# Patient Record
Sex: Male | Born: 1950
Health system: Southern US, Community
[De-identification: ages and names within clinical notes are randomized; demographics above are authoritative.]

## PROBLEM LIST (undated history)

## (undated) DIAGNOSIS — C4490 Unspecified malignant neoplasm of skin, unspecified: Secondary | ICD-10-CM

## (undated) DIAGNOSIS — I10 Essential (primary) hypertension: Secondary | ICD-10-CM

## (undated) DIAGNOSIS — M1990 Unspecified osteoarthritis, unspecified site: Secondary | ICD-10-CM

## (undated) DIAGNOSIS — F329 Major depressive disorder, single episode, unspecified: Secondary | ICD-10-CM

## (undated) DIAGNOSIS — F32A Depression, unspecified: Secondary | ICD-10-CM

## (undated) HISTORY — PX: SKIN CANCER EXCISION: SHX779

## (undated) HISTORY — DX: Depression, unspecified: F32.A

## (undated) HISTORY — DX: Unspecified osteoarthritis, unspecified site: M19.90

## (undated) HISTORY — DX: Major depressive disorder, single episode, unspecified: F32.9

## (undated) HISTORY — PX: COLONOSCOPY: SHX174

## (undated) HISTORY — PX: INGUINAL HERNIA REPAIR: SUR1180

## (undated) HISTORY — DX: Essential (primary) hypertension: I10

---

## 2004-06-03 ENCOUNTER — Encounter: Admission: RE | Admit: 2004-06-03 | Discharge: 2004-06-03 | Payer: Self-pay | Admitting: Internal Medicine

## 2005-08-29 ENCOUNTER — Ambulatory Visit: Payer: Self-pay | Admitting: Internal Medicine

## 2005-09-13 ENCOUNTER — Ambulatory Visit: Payer: Self-pay | Admitting: Internal Medicine

## 2005-09-22 ENCOUNTER — Ambulatory Visit: Payer: Self-pay | Admitting: Internal Medicine

## 2005-10-05 ENCOUNTER — Ambulatory Visit: Payer: Self-pay | Admitting: Internal Medicine

## 2005-10-17 ENCOUNTER — Encounter: Admission: RE | Admit: 2005-10-17 | Discharge: 2005-10-17 | Payer: Self-pay | Admitting: Internal Medicine

## 2005-10-17 ENCOUNTER — Ambulatory Visit: Payer: Self-pay | Admitting: Internal Medicine

## 2005-12-27 ENCOUNTER — Ambulatory Visit (HOSPITAL_COMMUNITY): Admission: RE | Admit: 2005-12-27 | Discharge: 2005-12-27 | Payer: Self-pay | Admitting: Surgery

## 2007-05-08 ENCOUNTER — Emergency Department (HOSPITAL_COMMUNITY): Admission: EM | Admit: 2007-05-08 | Discharge: 2007-05-08 | Payer: Self-pay | Admitting: Emergency Medicine

## 2007-05-13 ENCOUNTER — Ambulatory Visit: Payer: Self-pay | Admitting: Internal Medicine

## 2007-05-13 DIAGNOSIS — F329 Major depressive disorder, single episode, unspecified: Secondary | ICD-10-CM

## 2007-05-13 DIAGNOSIS — G5600 Carpal tunnel syndrome, unspecified upper limb: Secondary | ICD-10-CM | POA: Insufficient documentation

## 2007-05-13 DIAGNOSIS — F32A Depression, unspecified: Secondary | ICD-10-CM | POA: Insufficient documentation

## 2007-05-13 DIAGNOSIS — F3289 Other specified depressive episodes: Secondary | ICD-10-CM | POA: Insufficient documentation

## 2007-05-13 DIAGNOSIS — L259 Unspecified contact dermatitis, unspecified cause: Secondary | ICD-10-CM | POA: Insufficient documentation

## 2007-05-13 DIAGNOSIS — R7989 Other specified abnormal findings of blood chemistry: Secondary | ICD-10-CM | POA: Insufficient documentation

## 2007-05-13 DIAGNOSIS — R1032 Left lower quadrant pain: Secondary | ICD-10-CM | POA: Insufficient documentation

## 2007-05-13 LAB — CONVERTED CEMR LAB
Free T4: 1.1 ng/dL (ref 0.6–1.6)
Hgb A1c MFr Bld: 5.2 % (ref 4.6–6.0)
T3, Free: 3.2 pg/mL (ref 2.3–4.2)
TSH: 0.8 microintl units/mL (ref 0.35–5.50)

## 2007-05-14 ENCOUNTER — Encounter: Admission: RE | Admit: 2007-05-14 | Discharge: 2007-05-14 | Payer: Self-pay | Admitting: Internal Medicine

## 2007-12-26 ENCOUNTER — Ambulatory Visit: Payer: Self-pay | Admitting: Internal Medicine

## 2007-12-26 DIAGNOSIS — E782 Mixed hyperlipidemia: Secondary | ICD-10-CM | POA: Insufficient documentation

## 2007-12-26 DIAGNOSIS — R079 Chest pain, unspecified: Secondary | ICD-10-CM | POA: Insufficient documentation

## 2007-12-26 LAB — CONVERTED CEMR LAB
Cholesterol, target level: 200 mg/dL
HDL goal, serum: 40 mg/dL
LDL Goal: 160 mg/dL

## 2008-11-09 DIAGNOSIS — K8020 Calculus of gallbladder without cholecystitis without obstruction: Secondary | ICD-10-CM | POA: Insufficient documentation

## 2011-04-21 NOTE — Op Note (Signed)
NAMEJANZIEL, Collin Garza                 ACCOUNT NO.:  [id number]   MEDICAL RECORD NO.:  [id number]          PATIENT TYPE:  AMB   LOCATION:  DAY                          FACILITY:  [facility]   PHYSICIAN:  Thomas A. Cornett, M.D.DATE OF BIRTH:  [birth year]   DATE OF PROCEDURE:  12/27/2005  DATE OF DISCHARGE:                                 OPERATIVE REPORT   PREOPERATIVE DIAGNOSIS:  Right inguinal hernia.   POSTOPERATIVE DIAGNOSIS:  Direct right inguinal hernia   PROCEDURE:  Repair of right inguinal hernia with PHS mesh system.   ASSISTANT SURGEON:  Maisie Fus A. Cornett, M.D.   ANESTHESIA:  LMA with 20 mL of 0.25% Sensorcaine local.   ESTIMATED BLOOD LOSS:  10 mL.   SPECIMENS:  None.   DRAINS:  None.   INDICATIONS FOR PROCEDURE:  The patient is a 60 year old male with a  progressively enlarging right inguinal hernia, which has been causing him  pain.  After discussion of this, he wished to have it repaired.  Informed  consent was obtained, and we proceeded.   DESCRIPTION OF PROCEDURE:  The patient was brought to the operating room and  placed supine.  His right inguinal region was prepped and draped in a  sterile fashion.  He received preoperative antibiotics.  An incision was  made in his right inguinal crease after infiltration of the skin with 0.25%  Sensorcaine.  Dissection was carried down through Scarpa fascia and the  superficial epigastric vessels were divided with Vicryl ties.  The  aponeurosis of the external oblique was encountered and infiltrated with  0.25% Sensorcaine.  A small incision was made in the direction of the fibers  of the external oblique using a scalpel, and Metzenbaum scissors were used  to open this.  Cord structures were identified and encircled with a one-inch  Penrose drain.  There was an indirect sac of note, and this was dissected  away from the cord structures.  There was no evidence of indirect sac.  The  ilioinguinal nerve was divided.  Once I  dissected the sac, I was able to  open the sac into the preperitoneal space.  I used a sponge to bluntly  dissect out the preperitoneal space in order to place a large Prolene hernia  system mesh in this.  We placed the preperitoneal space in the peritoneal  repaired space and then pulled it up snug with the onlay piece.  I secured  the connector piece to the transversalis muscle with a 0 Vicryl.  I then  secured the mesh down to Cooper's ligament with 0 Vicryl.  Interrupted 2-0  Prolene were used to gently secure the onlay piece of mesh to the shelving  edge of the inguinal ligament and to the conjoined tendon medially.  I slit  this cut for the cord structures, and this was secured with 2-0 Prolene.  Once the mesh was adequately secured, it laid very nicely.  Irrigation was  used and hemostasis was found to be excellent.  I closed the aponeurosis of  the external oblique with a 2-0 Vicryl.  Vicryl 3-0  was used to  close Scarpa's layer and 4-0 Monocryl was used to close the skin.  All  sponge, needle and instrument counts were found be correct at this portion  of the case.  The patient was then awoken and taken to recovery in  satisfactory condition.      Thomas A. Cornett, M.D.  Electronically Signed     TAC/MEDQ  D:  12/27/2005  T:  12/28/2005  Job:  [id number]

## 2011-09-21 LAB — URINALYSIS, ROUTINE W REFLEX MICROSCOPIC
Bilirubin Urine: NEGATIVE
Glucose, UA: NEGATIVE
Hgb urine dipstick: NEGATIVE
Nitrite: NEGATIVE
Protein, ur: NEGATIVE
Specific Gravity, Urine: 1.017
Urobilinogen, UA: 0.2
pH: 6

## 2011-09-21 LAB — COMPREHENSIVE METABOLIC PANEL
ALT: 17
AST: 20
Albumin: 4.3
Alkaline Phosphatase: 48
BUN: 11
CO2: 23
Calcium: 9.5
Chloride: 105
Creatinine, Ser: 0.87
GFR calc Af Amer: >60
GFR calc non Af Amer: >60
Glucose, Bld: 128 — ABNORMAL HIGH
Potassium: 3.7
Sodium: 139
Total Bilirubin: 1.3 — ABNORMAL HIGH
Total Protein: 6.3

## 2011-09-21 LAB — CBC
HCT: 45.5
Hemoglobin: 15.6
MCHC: 34.3
MCV: 92.4
Platelets: 220
RBC: 4.93
RDW: 13.4
WBC: 12 — ABNORMAL HIGH

## 2011-09-21 LAB — DIFFERENTIAL
Basophils Absolute: 0
Basophils Relative: 0
Eosinophils Absolute: 0
Eosinophils Relative: 0
Lymphocytes Relative: 15
Lymphs Abs: 1.8
Monocytes Absolute: 0.8 — ABNORMAL HIGH
Monocytes Relative: 7
Neutro Abs: 9.3 — ABNORMAL HIGH
Neutrophils Relative %: 78 — ABNORMAL HIGH

## 2013-07-29 DIAGNOSIS — R209 Unspecified disturbances of skin sensation: Secondary | ICD-10-CM | POA: Insufficient documentation

## 2015-04-23 ENCOUNTER — Encounter: Payer: Self-pay | Admitting: Internal Medicine

## 2015-07-09 LAB — CBC AND DIFFERENTIAL
HCT: 48 (ref 41–53)
HEMOGLOBIN: 16.2 (ref 13.5–17.5)
WBC: 8.6

## 2015-08-17 ENCOUNTER — Encounter: Payer: Self-pay | Admitting: Internal Medicine

## 2015-09-22 ENCOUNTER — Ambulatory Visit (AMBULATORY_SURGERY_CENTER): Payer: Self-pay

## 2015-09-22 VITALS — Ht 71.0 in | Wt 187.6 lb

## 2015-09-22 DIAGNOSIS — Z1211 Encounter for screening for malignant neoplasm of colon: Secondary | ICD-10-CM

## 2015-09-22 NOTE — Progress Notes (Signed)
No allergies to eggs or soy No diet/weight loss meds No home oxygen No past problems with anesthesia  Refused emmi 

## 2015-10-06 ENCOUNTER — Encounter: Payer: Self-pay | Admitting: Internal Medicine

## 2015-10-14 ENCOUNTER — Encounter: Payer: Self-pay | Admitting: Internal Medicine

## 2015-12-15 ENCOUNTER — Encounter: Payer: Self-pay | Admitting: Internal Medicine

## 2015-12-15 ENCOUNTER — Ambulatory Visit (AMBULATORY_SURGERY_CENTER): Payer: BLUE CROSS/BLUE SHIELD | Admitting: Internal Medicine

## 2015-12-15 VITALS — BP 113/65 | HR 62 | Temp 97.1°F | Resp 24 | Ht 71.0 in | Wt 187.0 lb

## 2015-12-15 DIAGNOSIS — Z1211 Encounter for screening for malignant neoplasm of colon: Secondary | ICD-10-CM

## 2015-12-15 MED ORDER — SODIUM CHLORIDE 0.9 % IV SOLN: 500.0000 mL | INTRAVENOUS | Status: DC

## 2015-12-15 NOTE — Progress Notes (Signed)
Report to PACU, RN, vss, BBS= Clear.  

## 2015-12-15 NOTE — Op Note (Signed)
[name] [place], [id number]   COLONOSCOPY PROCEDURE REPORT  PATIENT: Collin Garza, Collin Garza  MR#: [id number] BIRTHDATE: Nov 13, 1951 , 64  yrs. old GENDER: male ENDOSCOPIST: Gatha Mayer, MD, [facility] PROCEDURE DATE:  12/15/2015 PROCEDURE:   Colonoscopy, screening First Screening Colonoscopy - Avg.  risk and is 50 yrs.  old or older - No.  Prior Negative Screening - Now for repeat screening. 10 or more years since last screening  History of Adenoma - Now for follow-up colonoscopy & has been > or = to 3 yrs.  N/A  Polyps removed today? No Recommend repeat exam, <10 yrs? No ASA CLASS:   Class II INDICATIONS:Screening for colonic neoplasia and Colorectal Neoplasm Risk Assessment for this procedure is average risk. MEDICATIONS: Propofol 250 mg IV and Monitored anesthesia care  DESCRIPTION OF PROCEDURE:   After the risks benefits and alternatives of the procedure were thoroughly explained, informed consent was obtained.  The digital rectal exam revealed no abnormalities of the rectum, revealed the prostate was not enlarged, and revealed no prostatic nodules.   The LB [id number]  endoscope was introduced through the anus and advanced to the cecum, which was identified by both the appendix and ileocecal valve. No adverse events experienced.   The quality of the prep was excellent.  (MiraLax was used)  The instrument was then slowly withdrawn as the colon was fully examined. Estimated blood loss is zero unless otherwise noted in this procedure report.      COLON FINDINGS: There was diverticulosis noted in the sigmoid colon. The examination was otherwise normal.  Retroflexed views revealed no abnormalities. The time to cecum = 3.7 Withdrawal time = 7.6 The scope was withdrawn and the procedure completed. COMPLICATIONS: There were no immediate complications.  ENDOSCOPIC IMPRESSION: 1.   Diverticulosis was noted in the sigmoid colon 2.   The examination  was otherwise normal - excellent prep - second screening  RECOMMENDATIONS: Repeat colonoscopy 10 years. 2027  eSigned:  Gatha Mayer, MD, [facility] 12/15/2015 12:05 PM   cc: The Patient and Dr. Bernerd Limbo

## 2015-12-15 NOTE — Patient Instructions (Addendum)
No polyps or cancer seen.  You do have diverticulosis - thickened muscle rings and pouches in the colon wall. Please read the handout about this condition.  Next routine colonoscopy in 10 years - 2027  I appreciate the opportunity to care for you. Gatha Mayer, MD, FACG  YOU HAD AN ENDOSCOPIC PROCEDURE TODAY AT [facility]:   Refer to the procedure report that was given to you for any specific questions about what was found during the examination.  If the procedure report does not answer your questions, please call your gastroenterologist to clarify.  If you requested that your care partner not be given the details of your procedure findings, then the procedure report has been included in a sealed envelope for you to review at your convenience later.  YOU SHOULD EXPECT: Some feelings of bloating in the abdomen. Passage of more gas than usual.  Walking can help get rid of the air that was put into your GI tract during the procedure and reduce the bloating. If you had a lower endoscopy (such as a colonoscopy or flexible sigmoidoscopy) you may notice spotting of blood in your stool or on the toilet paper. If you underwent a bowel prep for your procedure, you may not have a normal bowel movement for a few days.  Please Note:  You might notice some irritation and congestion in your nose or some drainage.  This is from the oxygen used during your procedure.  There is no need for concern and it should clear up in a day or so.  SYMPTOMS TO REPORT IMMEDIATELY:   Following lower endoscopy (colonoscopy or flexible sigmoidoscopy):  Excessive amounts of blood in the stool  Significant tenderness or worsening of abdominal pains  Swelling of the abdomen that is new, acute  Fever of 100F or higher   For urgent or emergent issues, a gastroenterologist can be reached at any hour by calling [phone].   DIET: Your first meal following the procedure should be a small meal  and then it is ok to progress to your normal diet. Heavy or fried foods are harder to digest and may make you feel nauseous or bloated.  Likewise, meals heavy in dairy and vegetables can increase bloating.  Drink plenty of fluids but you should avoid alcoholic beverages for 24 hours.  ACTIVITY:  You should plan to take it easy for the rest of today and you should NOT DRIVE or use heavy machinery until tomorrow (because of the sedation medicines used during the test).    FOLLOW UP: Our staff will call the number listed on your records the next business day following your procedure to check on you and address any questions or concerns that you may have regarding the information given to you following your procedure. If we do not reach you, we will leave a message.  However, if you are feeling well and you are not experiencing any problems, there is no need to return our call.  We will assume that you have returned to your regular daily activities without incident.  If any biopsies were taken you will be contacted by phone or by letter within the next 1-3 weeks.  Please call us at [phone] if you have not heard about the biopsies in 3 weeks.    SIGNATURES/CONFIDENTIALITY: You and/or your care partner have signed paperwork which will be entered into your electronic medical record.  These signatures attest to the fact that that the information above  on your After Visit Summary has been reviewed and is understood.  Full responsibility of the confidentiality of this discharge information lies with you and/or your care-partner.

## 2015-12-16 ENCOUNTER — Telehealth: Payer: Self-pay

## 2015-12-16 NOTE — Telephone Encounter (Signed)
  Follow up Call-  Call back number 12/15/2015  Post procedure Call Back phone  # [phone]  Permission to leave phone message Yes     Patient was called for follow up after procedure on 12/15/2015. No answer at the number given for follow up. A message was left on the answering machine.

## 2016-03-30 DIAGNOSIS — L408 Other psoriasis: Secondary | ICD-10-CM | POA: Diagnosis not present

## 2016-03-30 DIAGNOSIS — L821 Other seborrheic keratosis: Secondary | ICD-10-CM | POA: Diagnosis not present

## 2016-03-30 DIAGNOSIS — Z86018 Personal history of other benign neoplasm: Secondary | ICD-10-CM | POA: Diagnosis not present

## 2016-03-30 DIAGNOSIS — D225 Melanocytic nevi of trunk: Secondary | ICD-10-CM | POA: Diagnosis not present

## 2016-03-30 DIAGNOSIS — D485 Neoplasm of uncertain behavior of skin: Secondary | ICD-10-CM | POA: Diagnosis not present

## 2016-03-30 DIAGNOSIS — D2239 Melanocytic nevi of other parts of face: Secondary | ICD-10-CM | POA: Diagnosis not present

## 2016-04-25 DIAGNOSIS — I10 Essential (primary) hypertension: Secondary | ICD-10-CM | POA: Diagnosis not present

## 2016-04-25 DIAGNOSIS — E784 Other hyperlipidemia: Secondary | ICD-10-CM | POA: Diagnosis not present

## 2016-04-25 DIAGNOSIS — Z1159 Encounter for screening for other viral diseases: Secondary | ICD-10-CM | POA: Diagnosis not present

## 2016-04-25 DIAGNOSIS — Z125 Encounter for screening for malignant neoplasm of prostate: Secondary | ICD-10-CM | POA: Diagnosis not present

## 2016-04-25 LAB — HEPATIC FUNCTION PANEL
ALT: 12 (ref 10–40)
AST: 21 (ref 14–40)
Alkaline Phosphatase: 69 (ref 25–125)
BILIRUBIN, TOTAL: 0.7

## 2016-04-25 LAB — BASIC METABOLIC PANEL
BUN: 16 (ref 4–21)
Creatinine: 0.9 (ref <=1.3)
Glucose: 116
Potassium: 5.2 (ref 3.4–5.3)
SODIUM: 136 — AB (ref 137–147)

## 2016-04-25 LAB — PSA: PSA: 3.7

## 2016-04-25 LAB — LIPID PANEL
Cholesterol: 228 — AB (ref 0–200)
HDL: 79 — AB (ref 35–70)
LDL CALC: 122
TRIGLYCERIDES: 135 (ref 40–160)

## 2016-09-07 DIAGNOSIS — M79605 Pain in left leg: Secondary | ICD-10-CM | POA: Diagnosis not present

## 2016-09-07 DIAGNOSIS — R634 Abnormal weight loss: Secondary | ICD-10-CM | POA: Diagnosis not present

## 2016-09-07 DIAGNOSIS — R61 Generalized hyperhidrosis: Secondary | ICD-10-CM | POA: Diagnosis not present

## 2016-09-07 DIAGNOSIS — Z23 Encounter for immunization: Secondary | ICD-10-CM | POA: Diagnosis not present

## 2016-09-07 DIAGNOSIS — M79604 Pain in right leg: Secondary | ICD-10-CM | POA: Diagnosis not present

## 2016-09-07 DIAGNOSIS — Z0000 Encounter for general adult medical examination without abnormal findings: Secondary | ICD-10-CM | POA: Diagnosis not present

## 2016-09-07 DIAGNOSIS — R0782 Intercostal pain: Secondary | ICD-10-CM | POA: Diagnosis not present

## 2016-09-08 DIAGNOSIS — K8020 Calculus of gallbladder without cholecystitis without obstruction: Secondary | ICD-10-CM | POA: Diagnosis not present

## 2016-09-08 DIAGNOSIS — I708 Atherosclerosis of other arteries: Secondary | ICD-10-CM | POA: Diagnosis not present

## 2016-09-08 DIAGNOSIS — M5136 Other intervertebral disc degeneration, lumbar region: Secondary | ICD-10-CM | POA: Diagnosis not present

## 2016-09-08 DIAGNOSIS — R079 Chest pain, unspecified: Secondary | ICD-10-CM | POA: Diagnosis not present

## 2016-09-08 DIAGNOSIS — J449 Chronic obstructive pulmonary disease, unspecified: Secondary | ICD-10-CM | POA: Diagnosis not present

## 2017-01-02 DIAGNOSIS — Z6824 Body mass index (BMI) 24.0-24.9, adult: Secondary | ICD-10-CM | POA: Diagnosis not present

## 2017-01-02 DIAGNOSIS — W19XXXA Unspecified fall, initial encounter: Secondary | ICD-10-CM | POA: Diagnosis not present

## 2017-01-02 DIAGNOSIS — R2689 Other abnormalities of gait and mobility: Secondary | ICD-10-CM | POA: Diagnosis not present

## 2017-01-02 DIAGNOSIS — H6122 Impacted cerumen, left ear: Secondary | ICD-10-CM | POA: Diagnosis not present

## 2017-01-03 DIAGNOSIS — R42 Dizziness and giddiness: Secondary | ICD-10-CM | POA: Diagnosis not present

## 2017-01-03 DIAGNOSIS — W19XXXA Unspecified fall, initial encounter: Secondary | ICD-10-CM | POA: Diagnosis not present

## 2017-01-03 DIAGNOSIS — R2689 Other abnormalities of gait and mobility: Secondary | ICD-10-CM | POA: Diagnosis not present

## 2017-04-04 DIAGNOSIS — L723 Sebaceous cyst: Secondary | ICD-10-CM | POA: Diagnosis not present

## 2017-04-04 DIAGNOSIS — D2261 Melanocytic nevi of right upper limb, including shoulder: Secondary | ICD-10-CM | POA: Diagnosis not present

## 2017-04-04 DIAGNOSIS — D485 Neoplasm of uncertain behavior of skin: Secondary | ICD-10-CM | POA: Diagnosis not present

## 2017-04-04 DIAGNOSIS — L821 Other seborrheic keratosis: Secondary | ICD-10-CM | POA: Diagnosis not present

## 2017-04-04 DIAGNOSIS — Z86018 Personal history of other benign neoplasm: Secondary | ICD-10-CM | POA: Diagnosis not present

## 2017-04-04 DIAGNOSIS — L570 Actinic keratosis: Secondary | ICD-10-CM | POA: Diagnosis not present

## 2017-11-20 DIAGNOSIS — Z23 Encounter for immunization: Secondary | ICD-10-CM | POA: Diagnosis not present

## 2017-12-06 ENCOUNTER — Encounter: Payer: Self-pay | Admitting: Family Medicine

## 2017-12-06 ENCOUNTER — Ambulatory Visit (INDEPENDENT_AMBULATORY_CARE_PROVIDER_SITE_OTHER): Payer: BLUE CROSS/BLUE SHIELD

## 2017-12-06 ENCOUNTER — Ambulatory Visit: Payer: BLUE CROSS/BLUE SHIELD | Admitting: Family Medicine

## 2017-12-06 VITALS — BP 142/98 | HR 70 | Ht 71.0 in | Wt 177.1 lb

## 2017-12-06 DIAGNOSIS — I10 Essential (primary) hypertension: Secondary | ICD-10-CM | POA: Diagnosis not present

## 2017-12-06 DIAGNOSIS — M8588 Other specified disorders of bone density and structure, other site: Secondary | ICD-10-CM

## 2017-12-06 DIAGNOSIS — S20211A Contusion of right front wall of thorax, initial encounter: Secondary | ICD-10-CM | POA: Diagnosis not present

## 2017-12-06 DIAGNOSIS — I2510 Atherosclerotic heart disease of native coronary artery without angina pectoris: Secondary | ICD-10-CM | POA: Diagnosis not present

## 2017-12-06 DIAGNOSIS — E559 Vitamin D deficiency, unspecified: Secondary | ICD-10-CM | POA: Diagnosis not present

## 2017-12-06 DIAGNOSIS — E782 Mixed hyperlipidemia: Secondary | ICD-10-CM

## 2017-12-06 DIAGNOSIS — R972 Elevated prostate specific antigen [PSA]: Secondary | ICD-10-CM | POA: Diagnosis not present

## 2017-12-06 DIAGNOSIS — E875 Hyperkalemia: Secondary | ICD-10-CM | POA: Diagnosis not present

## 2017-12-06 DIAGNOSIS — Z0000 Encounter for general adult medical examination without abnormal findings: Secondary | ICD-10-CM | POA: Insufficient documentation

## 2017-12-06 DIAGNOSIS — S2241XA Multiple fractures of ribs, right side, initial encounter for closed fracture: Secondary | ICD-10-CM | POA: Diagnosis not present

## 2017-12-06 MED ORDER — TRAMADOL HCL 50 MG PO TABS
50.0000 mg | ORAL_TABLET | Freq: Every day | ORAL | 0 refills | Status: DC
Start: 2017-12-06 — End: 2018-06-17

## 2017-12-06 MED ORDER — AMLODIPINE BESYLATE 5 MG PO TABS: 5.0000 mg | ORAL_TABLET | Freq: Every day | ORAL | 0 refills | Status: DC

## 2017-12-06 NOTE — Progress Notes (Addendum)
Subjective:  Patient ID: Collin Garza, male    DOB: May 27, 1951  Age: 67 y.o. MRN: [id number]  CC: Establish Care   HPI Collin Garza presents for establishment of care and to be evaluated for a chest wall contusion he sustained after slipping on the ice and snow a few weeks.  He tells that he slipped and fell backwards with the front of his weight falling onto the right posterior chest wall.  He has a history of fractured ribs during [place] 45 years ago.  He is treated these with a rib binder and an occasional Aleve.  They seem to be doing better.  He has a history of high blood pressure but is not currently taking medicine.  He lives with his wife.  He and his wife have been raising their 24 year old grandson since he was a small child.  Another granddaughter recently moved back in with him.  The 65 year old is a special needs child.  He is nonfasting today and will return fasting.  He quit smoking 45 years ago.  He drinks 6 ounces of alcohol weekly.  He is retired from [employer].  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  History Collin Garza has a past medical history of Arthritis, Depression, and Hypertension.   He has a past surgical history that includes Inguinal hernia repair and Colonoscopy.   His family history includes Alcohol abuse in his brother and daughter; Arthritis in his brother; Asthma in his brother; Breast cancer in his maternal aunt and maternal grandmother; COPD in his brother, father, and paternal grandmother; Cancer in his father and maternal grandmother; Depression in his brother, daughter, and mother; Diabetes in his daughter and maternal uncle; Early death in his mother; Hearing loss in his brother and father; Heart attack in his brother and mother; Heart disease in his maternal uncle; Pancreatic cancer in his maternal grandmother; Prostate cancer in his cousin; Stomach cancer in his paternal aunt.He reports that he has quit smoking. he has never used smokeless tobacco.  He reports that he drinks about 4.2 - 5.4 oz of alcohol per week. He reports that he does not use drugs.  Outpatient Medications Prior to Visit  Medication Sig Dispense Refill  . co-enzyme Q-10 30 MG capsule Take 300 mg by mouth every other day.     [name] amLODipine (NORVASC) 10 MG tablet Take 10 mg by mouth.    [name] aspirin 81 MG tablet Take 81 mg by mouth daily.    . Multiple Vitamin (MULTIVITAMIN) tablet Take 1 tablet by mouth daily.     No facility-administered medications prior to visit.     ROS Review of Systems  Constitutional: Negative.   HENT: Negative.   Eyes: Negative.   Respiratory: Negative for chest tightness and shortness of breath.   Cardiovascular: Negative.   Gastrointestinal: Negative.   Endocrine: Negative for polyphagia and polyuria.  Skin: Negative.     Objective:  BP (!) 142/98 (BP Location: Left Arm, Patient Position: Sitting, Cuff Size: Normal)   Pulse 70   Ht 5\' 11"  (1.803 m)   Wt 177 lb 2 oz (80.3 kg)   SpO2 99%   BMI 24.70 kg/m   Physical Exam  Constitutional: He is oriented to person, place, and time. He appears well-developed and well-nourished. No distress.  HENT:  Head: Normocephalic and atraumatic.  Right Ear: External ear normal.  Left Ear: External ear normal.  Mouth/Throat: Oropharynx is clear and moist. No oropharyngeal exudate.  Eyes: Conjunctivae are normal.  Pupils are equal, round, and reactive to light. Right eye exhibits no discharge. Left eye exhibits no discharge. No scleral icterus.  Neck: Neck supple. No JVD present. No tracheal deviation present. No thyromegaly present.  Cardiovascular: Normal rate, regular rhythm and normal heart sounds.  Pulmonary/Chest: Effort normal and breath sounds normal. No stridor.      Abdominal: Bowel sounds are normal.  Lymphadenopathy:    He has no cervical adenopathy.  Neurological: He is alert and oriented to person, place, and time.  Skin: Skin is warm and dry. He is not diaphoretic. No  pallor.  Psychiatric: He has a normal mood and affect. His behavior is normal.      Assessment & Plan:   Collin Garza was seen today for establish care.  Diagnoses and all orders for this visit:  Chest wall contusion, right, initial encounter -     Cancel: DG Ribs Unilateral Right; Future -     Cancel: DG Ribs Unilateral Right -     traMADol (ULTRAM) 50 MG tablet; Take 1 tablet (50 mg total) by mouth at bedtime. As needed for pain  HYPERLIPIDEMIA -     Cancel: Comprehensive metabolic panel -     Cancel: Lipid panel -     Comprehensive metabolic panel; Future -     Lipid panel; Future  Healthcare maintenance -     Cancel: CBC -     Cancel: Comprehensive metabolic panel -     Cancel: HIV antibody -     Cancel: PSA -     Cancel: TSH -     Cancel: Urinalysis, Routine w reflex microscopic -     Cancel: VITAMIN D 25 Hydroxy (Vit-D Deficiency, Fractures); Future -     CBC; Future -     Comprehensive metabolic panel; Future -     HIV antibody; Future -     Lipid panel; Future -     PSA; Future -     TSH; Future -     Urinalysis, Routine w reflex microscopic; Future -     VITAMIN D 25 Hydroxy (Vit-D Deficiency, Fractures); Future  Essential hypertension -     CBC; Future -     Comprehensive metabolic panel; Future -     TSH; Future -     Urinalysis, Routine w reflex microscopic; Future -     amLODipine (NORVASC) 5 MG tablet; Take 1 tablet (5 mg total) by mouth daily.  ASCVD (arteriosclerotic cardiovascular disease)  Osteopenia of other site -     DG Bone Density; Future  Vitamin D deficiency -     Vitamin D (Ergocalciferol) (DRISDOL) capsule 50,000 Units  Elevated PSA  Serum potassium elevated   I have discontinued Collin Garza's multivitamin, aspirin, and amLODipine. I am also having him start on amLODipine and traMADol. Additionally, I am having him maintain his co-enzyme Q-10. We will continue to administer Vitamin D (Ergocalciferol).  Meds ordered this encounter    Medications  . amLODipine (NORVASC) 5 MG tablet    Sig: Take 1 tablet (5 mg total) by mouth daily.    Dispense:  90 tablet    Refill:  0  . traMADol (ULTRAM) 50 MG tablet    Sig: Take 1 tablet (50 mg total) by mouth at bedtime. As needed for pain    Dispense:  30 tablet    Refill:  0  . Vitamin D (Ergocalciferol) (DRISDOL) capsule 50,000 Units     Follow-up: No Follow-up on file.  Gwyndolyn Saxon  Krystal Clark, MD

## 2017-12-07 ENCOUNTER — Other Ambulatory Visit: Payer: Self-pay | Admitting: Family Medicine

## 2017-12-07 DIAGNOSIS — M85859 Other specified disorders of bone density and structure, unspecified thigh: Secondary | ICD-10-CM | POA: Insufficient documentation

## 2017-12-07 DIAGNOSIS — I2510 Atherosclerotic heart disease of native coronary artery without angina pectoris: Secondary | ICD-10-CM | POA: Insufficient documentation

## 2017-12-07 DIAGNOSIS — S20211A Contusion of right front wall of thorax, initial encounter: Secondary | ICD-10-CM

## 2017-12-07 NOTE — Addendum Note (Signed)
Addended by: Jon Billings on: 12/07/2017 09:42 AM   Modules accepted: Orders

## 2017-12-12 ENCOUNTER — Other Ambulatory Visit (INDEPENDENT_AMBULATORY_CARE_PROVIDER_SITE_OTHER): Payer: BLUE CROSS/BLUE SHIELD

## 2017-12-12 DIAGNOSIS — Z0000 Encounter for general adult medical examination without abnormal findings: Secondary | ICD-10-CM

## 2017-12-12 DIAGNOSIS — I10 Essential (primary) hypertension: Secondary | ICD-10-CM

## 2017-12-12 DIAGNOSIS — E782 Mixed hyperlipidemia: Secondary | ICD-10-CM | POA: Diagnosis not present

## 2017-12-12 LAB — URINALYSIS, ROUTINE W REFLEX MICROSCOPIC
Bilirubin Urine: NEGATIVE
HGB URINE DIPSTICK: NEGATIVE
Ketones, ur: NEGATIVE
Leukocytes, UA: NEGATIVE
Nitrite: NEGATIVE
PH: 7.5 (ref 5.0–8.0)
RBC / HPF: NONE SEEN (ref 0–?)
SPECIFIC GRAVITY, URINE: 1.015 (ref 1.000–1.030)
TOTAL PROTEIN, URINE-UPE24: NEGATIVE
Urine Glucose: NEGATIVE
Urobilinogen, UA: 0.2 (ref 0.0–1.0)

## 2017-12-12 LAB — CBC
HEMATOCRIT: 45.9 % (ref 39.0–52.0)
Hemoglobin: 15.3 g/dL (ref 13.0–17.0)
MCHC: 33.3 g/dL (ref 30.0–36.0)
MCV: 96.3 fl (ref 78.0–100.0)
Platelets: 228 10*3/uL (ref 150.0–400.0)
RBC: 4.77 Mil/uL (ref 4.22–5.81)
RDW: 14 % (ref 11.5–15.5)
WBC: 7.2 10*3/uL (ref 4.0–10.5)

## 2017-12-12 LAB — LIPID PANEL
CHOLESTEROL: 199 mg/dL (ref 0–200)
HDL: 67.1 mg/dL (ref >39.00)
LDL CALC: 110 mg/dL — AB (ref 0–99)
NonHDL: 131.42
TRIGLYCERIDES: 105 mg/dL (ref 0.0–149.0)
Total CHOL/HDL Ratio: 3
VLDL: 21 mg/dL (ref 0.0–40.0)

## 2017-12-12 LAB — COMPREHENSIVE METABOLIC PANEL
ALBUMIN: 4.4 g/dL (ref 3.5–5.2)
ALT: 11 U/L (ref 0–53)
AST: 14 U/L (ref 0–37)
Alkaline Phosphatase: 63 U/L (ref 39–117)
BUN: 13 mg/dL (ref 6–23)
CALCIUM: 9.7 mg/dL (ref 8.4–10.5)
CHLORIDE: 104 meq/L (ref 96–112)
CO2: 33 mEq/L — ABNORMAL HIGH (ref 19–32)
CREATININE: 0.77 mg/dL (ref 0.40–1.50)
GFR: 108.01 mL/min (ref >60.00)
Glucose, Bld: 106 mg/dL — ABNORMAL HIGH (ref 70–99)
Potassium: 5.5 mEq/L — ABNORMAL HIGH (ref 3.5–5.1)
Sodium: 144 mEq/L (ref 135–145)
Total Bilirubin: 0.6 mg/dL (ref 0.2–1.2)
Total Protein: 6.4 g/dL (ref 6.0–8.3)

## 2017-12-12 LAB — VITAMIN D 25 HYDROXY (VIT D DEFICIENCY, FRACTURES): VITD: 18.82 ng/mL — AB (ref 30.00–100.00)

## 2017-12-12 LAB — PSA: PSA: 4.03 ng/mL — ABNORMAL HIGH (ref 0.10–4.00)

## 2017-12-12 LAB — TSH: TSH: 0.81 u[IU]/mL (ref 0.35–4.50)

## 2017-12-13 DIAGNOSIS — E875 Hyperkalemia: Secondary | ICD-10-CM | POA: Insufficient documentation

## 2017-12-13 DIAGNOSIS — E559 Vitamin D deficiency, unspecified: Secondary | ICD-10-CM | POA: Insufficient documentation

## 2017-12-13 DIAGNOSIS — R972 Elevated prostate specific antigen [PSA]: Secondary | ICD-10-CM | POA: Insufficient documentation

## 2017-12-13 LAB — HIV ANTIBODY (ROUTINE TESTING W REFLEX): HIV: NONREACTIVE

## 2017-12-13 MED ORDER — VITAMIN D (ERGOCALCIFEROL) 1.25 MG (50000 UNIT) PO CAPS: 50000.0000 [IU] | ORAL_CAPSULE | ORAL | Status: DC

## 2017-12-13 NOTE — Addendum Note (Signed)
Addended by: Jon Billings on: 12/13/2017 09:45 AM   Modules accepted: Orders

## 2017-12-18 ENCOUNTER — Encounter: Payer: Self-pay | Admitting: Family Medicine

## 2017-12-18 ENCOUNTER — Ambulatory Visit (INDEPENDENT_AMBULATORY_CARE_PROVIDER_SITE_OTHER): Payer: BLUE CROSS/BLUE SHIELD | Admitting: Family Medicine

## 2017-12-18 VITALS — BP 128/80 | HR 75 | Ht 71.0 in | Wt 176.2 lb

## 2017-12-18 DIAGNOSIS — R7989 Other specified abnormal findings of blood chemistry: Secondary | ICD-10-CM | POA: Diagnosis not present

## 2017-12-18 DIAGNOSIS — E559 Vitamin D deficiency, unspecified: Secondary | ICD-10-CM | POA: Diagnosis not present

## 2017-12-18 DIAGNOSIS — M8588 Other specified disorders of bone density and structure, other site: Secondary | ICD-10-CM | POA: Diagnosis not present

## 2017-12-18 DIAGNOSIS — I2510 Atherosclerotic heart disease of native coronary artery without angina pectoris: Secondary | ICD-10-CM

## 2017-12-18 DIAGNOSIS — E875 Hyperkalemia: Secondary | ICD-10-CM | POA: Diagnosis not present

## 2017-12-18 DIAGNOSIS — R972 Elevated prostate specific antigen [PSA]: Secondary | ICD-10-CM

## 2017-12-18 DIAGNOSIS — I10 Essential (primary) hypertension: Secondary | ICD-10-CM | POA: Diagnosis not present

## 2017-12-18 DIAGNOSIS — E782 Mixed hyperlipidemia: Secondary | ICD-10-CM

## 2017-12-18 LAB — BASIC METABOLIC PANEL
BUN: 15 mg/dL (ref 6–23)
CO2: 30 mEq/L (ref 19–32)
CREATININE: 0.76 mg/dL (ref 0.40–1.50)
Calcium: 10.1 mg/dL (ref 8.4–10.5)
Chloride: 98 mEq/L (ref 96–112)
GFR: 109.64 mL/min (ref >60.00)
GLUCOSE: 121 mg/dL — AB (ref 70–99)
POTASSIUM: 5 meq/L (ref 3.5–5.1)
Sodium: 139 mEq/L (ref 135–145)

## 2017-12-18 NOTE — Progress Notes (Signed)
Subjective:  Patient ID: Collin Garza, male    DOB: [birth year]  Age: 67 y.o. MRN: [id number]  CC: Annual Exam   HPI CRAY MONNIN presents for a CPE and follow-up of his fractured ribs and lab abnormalities.  With his elevated PSA has a long-standing history of enlarged prostate he does experience nocturia x1 with some dribbling at that time.  He tells about excellent urine flow during the daytime when he has been up and about.  He is somewhat wary about prescription medicines because his mother had lupus and had problems with drug interactions.  He is not a milk drinker but does enjoy cheese.  I urged him to fill the vitamin D and start taking it.  I also urged him to go for the DEXA scan.  His rib fracture are healing he says that he only had to take 1 of the Ultram.  Outpatient Medications Prior to Visit  Medication Sig Dispense Refill  . amLODipine (NORVASC) 5 MG tablet Take 1 tablet (5 mg total) by mouth daily. 90 tablet 0  . co-enzyme Q-10 30 MG capsule Take 300 mg by mouth every other day.     . traMADol (ULTRAM) 50 MG tablet Take 1 tablet (50 mg total) by mouth at bedtime. As needed for pain 30 tablet 0   Facility-Administered Medications Prior to Visit  Medication Dose Route Frequency Provider Last Rate Last Dose  . Vitamin D (Ergocalciferol) (DRISDOL) capsule 50,000 Units  50,000 Units Oral Q7 days Libby Maw, MD        ROS Review of Systems  Constitutional: Negative.   HENT: Negative.   Eyes: Negative for photophobia and visual disturbance.  Respiratory: Negative for apnea, chest tightness and shortness of breath.   Cardiovascular: Positive for chest pain. Negative for leg swelling.  Gastrointestinal: Negative.   Endocrine: Negative for polyphagia and polyuria.  Genitourinary: Negative for difficulty urinating, frequency and hematuria.  Musculoskeletal: Negative.   Skin: Negative for color change, rash and wound.  Hematological: Does not bruise/bleed easily.    Psychiatric/Behavioral: Negative for dysphoric mood. The patient is not nervous/anxious.     Objective:  BP 128/80 (BP Location: Left Arm, Patient Position: Sitting, Cuff Size: Normal)   Pulse 75   Ht 5\' 11"  (1.803 m)   Wt 176 lb 4 oz (79.9 kg)   SpO2 98%   BMI 24.58 kg/m   BP Readings from Last 3 Encounters:  12/18/17 128/80  12/06/17 (!) 142/98  12/15/15 113/65    Wt Readings from Last 3 Encounters:  12/18/17 176 lb 4 oz (79.9 kg)  12/06/17 177 lb 2 oz (80.3 kg)  12/15/15 187 lb (84.8 kg)    Physical Exam  Constitutional: He is oriented to person, place, and time. He appears well-developed and well-nourished. No distress.  HENT:  Head: Normocephalic and atraumatic.  Right Ear: External ear normal.  Left Ear: External ear normal.  Mouth/Throat: Oropharynx is clear and moist. No oropharyngeal exudate.  Eyes: Conjunctivae and EOM are normal. Pupils are equal, round, and reactive to light. Right eye exhibits no discharge. Left eye exhibits no discharge.  Neck: Neck supple. No JVD present. No tracheal deviation present. No thyromegaly present.  Cardiovascular: Normal rate, regular rhythm and normal heart sounds.  Pulmonary/Chest: Effort normal and breath sounds normal. No stridor.  Abdominal: Soft. Bowel sounds are normal. He exhibits no distension. There is no tenderness. There is no rebound and no guarding.  Genitourinary: Rectal exam shows no external hemorrhoid,  no internal hemorrhoid, no fissure, no mass, no tenderness, anal tone normal and guaiac negative stool. Prostate is enlarged (3 plus smooth without mass). Prostate is not tender.  Lymphadenopathy:    He has no cervical adenopathy.  Neurological: He is alert and oriented to person, place, and time.  Skin: Skin is warm and dry. No rash noted. He is not diaphoretic.  Psychiatric: His behavior is normal.    Lab Results  Component Value Date   WBC 7.2 12/12/2017   HGB 15.3 12/12/2017   HCT 45.9 12/12/2017   PLT  228.0 12/12/2017   GLUCOSE 106 (H) 12/12/2017   CHOL 199 12/12/2017   TRIG 105.0 12/12/2017   HDL 67.10 12/12/2017   LDLCALC 110 (H) 12/12/2017   ALT 11 12/12/2017   AST 14 12/12/2017   NA 144 12/12/2017   K 5.5 (H) 12/12/2017   CL 104 12/12/2017   CREATININE 0.77 12/12/2017   BUN 13 12/12/2017   CO2 33 (H) 12/12/2017   TSH 0.81 12/12/2017   PSA 4.03 (H) 12/12/2017   HGBA1C 5.2 05/13/2007    No results found.  Assessment & Plan:   Jhalil was seen today for annual exam.  Diagnoses and all orders for this visit:  Essential hypertension -     Basic metabolic panel  ASCVD (arteriosclerotic cardiovascular disease)  Osteopenia of other site -     DG Bone Density; Future  HYPERLIPIDEMIA  Vitamin D deficiency  Elevated PSA  Serum potassium elevated -     Basic metabolic panel  Other specified abnormal findings of blood chemistry -     Basic metabolic panel   I am having Gagan E. Delsol maintain his co-enzyme Q-10, amLODipine, and traMADol. We will continue to administer Vitamin D (Ergocalciferol).  No orders of the defined types were placed in this encounter.  We discussed multiple issues.  These included the calcification seen on his aorta, osteopenia noted on his rib films his elevated PSA, elevated blood sugar, and elevated potassium.  He is again fasting today and we will recheck of blood sugar and potassium with a BMP.  He has a long-standing history of BPH and for now we will follow his slightly elevated PSA.  He does not wish to start a statin at this time but we will continue to follow his lipid profile closely.  I did urge him to have the DEXA scan done.  He wishes to wait until age 70 and receive his pneumonia vaccines at that time.  I advised him also to have the shingles vaccine.  Follow-up: No Follow-up on file.  Libby Maw, MD

## 2017-12-24 ENCOUNTER — Ambulatory Visit (INDEPENDENT_AMBULATORY_CARE_PROVIDER_SITE_OTHER)
Admission: RE | Admit: 2017-12-24 | Discharge: 2017-12-24 | Disposition: A | Discharge status: Home / Self Care | Payer: BLUE CROSS/BLUE SHIELD | Source: Ambulatory Visit | Attending: Family Medicine | Admitting: Family Medicine

## 2017-12-24 DIAGNOSIS — M8588 Other specified disorders of bone density and structure, other site: Secondary | ICD-10-CM | POA: Diagnosis not present

## 2017-12-30 DIAGNOSIS — M8588 Other specified disorders of bone density and structure, other site: Secondary | ICD-10-CM | POA: Diagnosis not present

## 2017-12-31 ENCOUNTER — Other Ambulatory Visit: Payer: Self-pay

## 2017-12-31 MED ORDER — VITAMIN D (ERGOCALCIFEROL) 1.25 MG (50000 UNIT) PO CAPS: 50000.0000 [IU] | ORAL_CAPSULE | ORAL | 1 refills | Status: AC

## 2018-02-20 DIAGNOSIS — H40003 Preglaucoma, unspecified, bilateral: Secondary | ICD-10-CM | POA: Diagnosis not present

## 2018-02-20 DIAGNOSIS — H40053 Ocular hypertension, bilateral: Secondary | ICD-10-CM | POA: Diagnosis not present

## 2018-03-12 ENCOUNTER — Other Ambulatory Visit: Payer: Self-pay | Admitting: Family Medicine

## 2018-03-12 DIAGNOSIS — I10 Essential (primary) hypertension: Secondary | ICD-10-CM

## 2018-04-16 DIAGNOSIS — L723 Sebaceous cyst: Secondary | ICD-10-CM | POA: Diagnosis not present

## 2018-04-16 DIAGNOSIS — L821 Other seborrheic keratosis: Secondary | ICD-10-CM | POA: Diagnosis not present

## 2018-04-16 DIAGNOSIS — D485 Neoplasm of uncertain behavior of skin: Secondary | ICD-10-CM | POA: Diagnosis not present

## 2018-04-16 DIAGNOSIS — D2261 Melanocytic nevi of right upper limb, including shoulder: Secondary | ICD-10-CM | POA: Diagnosis not present

## 2018-04-16 DIAGNOSIS — D225 Melanocytic nevi of trunk: Secondary | ICD-10-CM | POA: Diagnosis not present

## 2018-04-16 DIAGNOSIS — Z86018 Personal history of other benign neoplasm: Secondary | ICD-10-CM | POA: Diagnosis not present

## 2018-06-17 ENCOUNTER — Encounter: Payer: Self-pay | Admitting: Family Medicine

## 2018-06-17 ENCOUNTER — Ambulatory Visit: Payer: BLUE CROSS/BLUE SHIELD | Admitting: Family Medicine

## 2018-06-17 VITALS — BP 130/80 | HR 83 | Temp 98.5°F | Ht 71.0 in | Wt 171.0 lb

## 2018-06-17 DIAGNOSIS — R972 Elevated prostate specific antigen [PSA]: Secondary | ICD-10-CM

## 2018-06-17 DIAGNOSIS — R7309 Other abnormal glucose: Secondary | ICD-10-CM | POA: Diagnosis not present

## 2018-06-17 DIAGNOSIS — H6122 Impacted cerumen, left ear: Secondary | ICD-10-CM | POA: Diagnosis not present

## 2018-06-17 DIAGNOSIS — E559 Vitamin D deficiency, unspecified: Secondary | ICD-10-CM | POA: Diagnosis not present

## 2018-06-17 DIAGNOSIS — I2510 Atherosclerotic heart disease of native coronary artery without angina pectoris: Secondary | ICD-10-CM | POA: Diagnosis not present

## 2018-06-17 DIAGNOSIS — I10 Essential (primary) hypertension: Secondary | ICD-10-CM

## 2018-06-17 DIAGNOSIS — M85859 Other specified disorders of bone density and structure, unspecified thigh: Secondary | ICD-10-CM | POA: Diagnosis not present

## 2018-06-17 LAB — COMPREHENSIVE METABOLIC PANEL
ALK PHOS: 50 U/L (ref 39–117)
ALT: 14 U/L (ref 0–53)
AST: 18 U/L (ref 0–37)
Albumin: 4.9 g/dL (ref 3.5–5.2)
BUN: 14 mg/dL (ref 6–23)
CHLORIDE: 97 meq/L (ref 96–112)
CO2: 26 mEq/L (ref 19–32)
Calcium: 10.2 mg/dL (ref 8.4–10.5)
Creatinine, Ser: 0.89 mg/dL (ref 0.40–1.50)
GFR: 91.24 mL/min (ref >60.00)
GLUCOSE: 112 mg/dL — AB (ref 70–99)
POTASSIUM: 4.9 meq/L (ref 3.5–5.1)
SODIUM: 134 meq/L — AB (ref 135–145)
TOTAL PROTEIN: 7.2 g/dL (ref 6.0–8.3)
Total Bilirubin: 1.3 mg/dL — ABNORMAL HIGH (ref 0.2–1.2)

## 2018-06-17 LAB — LIPID PANEL
CHOL/HDL RATIO: 3
Cholesterol: 237 mg/dL — ABNORMAL HIGH (ref 0–200)
HDL: 77.1 mg/dL (ref >39.00)
LDL Cholesterol: 135 mg/dL — ABNORMAL HIGH (ref 0–99)
NonHDL: 160.19
TRIGLYCERIDES: 125 mg/dL (ref 0.0–149.0)
VLDL: 25 mg/dL (ref 0.0–40.0)

## 2018-06-17 LAB — CBC
HCT: 47.3 % (ref 39.0–52.0)
HEMOGLOBIN: 16.4 g/dL (ref 13.0–17.0)
MCHC: 34.7 g/dL (ref 30.0–36.0)
MCV: 94.7 fl (ref 78.0–100.0)
Platelets: 227 10*3/uL (ref 150.0–400.0)
RBC: 5 Mil/uL (ref 4.22–5.81)
RDW: 13.3 % (ref 11.5–15.5)
WBC: 7.8 10*3/uL (ref 4.0–10.5)

## 2018-06-17 LAB — VITAMIN D 25 HYDROXY (VIT D DEFICIENCY, FRACTURES): VITD: 47.13 ng/mL (ref 30.00–100.00)

## 2018-06-17 LAB — PSA: PSA: 4.74 ng/mL — ABNORMAL HIGH (ref 0.10–4.00)

## 2018-06-17 LAB — HEMOGLOBIN A1C: Hgb A1c MFr Bld: 5.3 % (ref 4.6–6.5)

## 2018-06-17 MED ORDER — AMLODIPINE BESYLATE 5 MG PO TABS: ORAL_TABLET | ORAL | 1 refills | Status: DC

## 2018-06-17 MED ORDER — CALCIUM CARBONATE-VITAMIN D 500-200 MG-UNIT PO TABS: 1.0000 | ORAL_TABLET | Freq: Two times a day (BID) | ORAL | 5 refills | Status: DC

## 2018-06-17 NOTE — Addendum Note (Signed)
Addended by: Jon Billings on: 06/17/2018 01:48 PM   Modules accepted: Orders

## 2018-06-17 NOTE — Progress Notes (Addendum)
Subjective:  Patient ID: Collin Garza, male    DOB: 14-Oct-1951  Age: 67 y.o. MRN: [id number]  CC: Follow-up   HPI Collin Garza presents for follow-up of his hypertension.  Blood pressure at home is been looking good.  He is running in the 1 8 teens over 73s.  No problems with the Norvasc.  He finished up his last high-dose vitamin D today.  We discussed that his DEXA scan did show thinning of his bones.  We will be following up his elevated PSA levels.  He has been taking saw palmetto with some relief but overall his urine flow is good.  Nocturia no more than once nightly.  Discussed the calcification was seen on his aorta with his last rib films.  He has a favorable lipid profile with a mildly elevated LDL and elevated HDL.  He quit smoking over 40 years ago and his blood pressure is well controlled.  He was at the beach swimming and developed left ear congestion after doing so.  Outpatient Medications Prior to Visit  Medication Sig Dispense Refill  . co-enzyme Q-10 30 MG capsule Take 300 mg by mouth every other day.     [name] amLODipine (NORVASC) 5 MG tablet TAKE 1 TABLET(5 MG) BY MOUTH DAILY 90 tablet 1  . traMADol (ULTRAM) 50 MG tablet Take 1 tablet (50 mg total) by mouth at bedtime. As needed for pain 30 tablet 0   No facility-administered medications prior to visit.     ROS Review of Systems  Constitutional: Negative for chills, fatigue, fever and unexpected weight change.  HENT: Positive for congestion and hearing loss. Negative for ear pain.   Eyes: Negative.   Respiratory: Negative.  Negative for cough, shortness of breath and wheezing.   Cardiovascular: Negative.  Negative for chest pain and palpitations.  Gastrointestinal: Negative for abdominal distention, abdominal pain, anal bleeding, blood in stool, constipation, diarrhea, nausea, rectal pain and vomiting.  Endocrine: Negative for polyphagia and polyuria.  Genitourinary: Negative for difficulty urinating, hematuria and  urgency.  Musculoskeletal: Negative for gait problem and joint swelling.  Skin: Negative for pallor and rash.  Allergic/Immunologic: Negative for immunocompromised state.  Neurological: Negative for weakness, numbness and headaches.  Hematological: Negative for adenopathy. Does not bruise/bleed easily.  Psychiatric/Behavioral: Negative.     Objective:  BP 130/80   Pulse 83   Temp 98.5 F (36.9 C)   Ht 5\' 11"  (1.803 m)   Wt 171 lb (77.6 kg)   SpO2 98%   BMI 23.85 kg/m   BP Readings from Last 3 Encounters:  06/17/18 130/80  12/18/17 128/80  12/06/17 (!) 142/98    Wt Readings from Last 3 Encounters:  06/17/18 171 lb (77.6 kg)  12/18/17 176 lb 4 oz (79.9 kg)  12/06/17 177 lb 2 oz (80.3 kg)    Physical Exam  Constitutional: He is oriented to person, place, and time. He appears well-developed and well-nourished. No distress.  Eyes: Pupils are equal, round, and reactive to light. Conjunctivae and EOM are normal. Right eye exhibits no discharge. Left eye exhibits no discharge. No scleral icterus.  Neck: No JVD present. No tracheal deviation present. No thyromegaly present.  Cardiovascular: Normal rate, regular rhythm and normal heart sounds.  Pulmonary/Chest: Effort normal and breath sounds normal.  Abdominal: Soft. Bowel sounds are normal. He exhibits no distension and no mass. There is no tenderness. There is no rebound and no guarding.  Musculoskeletal: He exhibits no edema.  Lymphadenopathy:  He has no cervical adenopathy.  Neurological: He is alert and oriented to person, place, and time.  Skin: Skin is warm and dry. Capillary refill takes less than 2 seconds. He is not diaphoretic.  Psychiatric: He has a normal mood and affect. His behavior is normal.    Lab Results  Component Value Date   WBC 7.8 06/17/2018   HGB 16.4 06/17/2018   HCT 47.3 06/17/2018   PLT 227.0 06/17/2018   GLUCOSE 112 (H) 06/17/2018   CHOL 237 (H) 06/17/2018   TRIG 125.0 06/17/2018   HDL  77.10 06/17/2018   LDLCALC 135 (H) 06/17/2018   ALT 14 06/17/2018   AST 18 06/17/2018   NA 134 (L) 06/17/2018   K 4.9 06/17/2018   CL 97 06/17/2018   CREATININE 0.89 06/17/2018   BUN 14 06/17/2018   CO2 26 06/17/2018   TSH 0.81 12/12/2017   PSA 4.74 (H) 06/17/2018   HGBA1C 5.3 06/17/2018    Dg Bone Density  Result Date: 12/30/2017 Date of study: 12/24/17 Exam: DUAL X-RAY ABSORPTIOMETRY (DXA) FOR BONE MINERAL DENSITY (BMD) Instrument: [employer] Provider: PCP Indication: follow up for low BMD Comparison: none (please note that it is not possible to compare data from different instruments) Clinical data: Pt is a 67 y.o. male with previous history of fracture. On calcium and vitamin D. Results:  Lumbar spine L1-L4 Femoral neck (FN) 33% distal radius T-score 2.0 RFN: -1.2 LFN: -1.5 n/a Change in BMD from previous DXA test (%) n/a n/a n/a (*) statistically significant Assessment: the BMD is low according to the [facility] classification for osteoporosis (see below). Fracture risk: moderate FRAX score: 10 year major osteoporotic risk: 19.6%. 10 year hip fracture risk: 1.6%. The thresholds for treatment are 20% and 3%, respectively. Comments: the technical quality of the study is good. Degenerative changes and scoliosis may falsely increase spine score. Evaluation for secondary causes should be considered if clinically indicated. Recommend optimizing calcium (1200 mg/day) and vitamin D (800 IU/day) intake. Followup: Repeat BMD is appropriate after 2 years or after 1-2 years if starting treatment. WHO criteria for diagnosis of osteoporosis in postmenopausal women and in men 48 y/o or older: - normal: T-score -1.0 to + 1.0 - osteopenia/low bone density: T-score between -2.5 and -1.0 - osteoporosis: T-score below -2.5 - severe osteoporosis: T-score below -2.5 with history of fragility fracture Note: although not part of the WHO classification, the presence of a fragility fracture, regardless of  the T-score, should be considered diagnostic of osteoporosis, provided other causes for the fracture have been excluded. Treatment: The National Osteoporosis Foundation recommends that treatment be considered in postmenopausal women and men age 14 or older with: 1. Hip or vertebral (clinical or morphometric) fracture 2. T-score of - 2.5 or lower at the spine or hip 3. 10-year fracture probability by FRAX of at least 20% for a major osteoporotic fracture and 3% for a hip fracture [name]    Assessment & Plan:   Collin Garza was seen today for follow-up.  Diagnoses and all orders for this visit:  Essential hypertension -     CBC -     Comprehensive metabolic panel -     amLODipine (NORVASC) 5 MG tablet; TAKE 1 TABLET(5 MG) BY MOUTH DAILY  ASCVD (arteriosclerotic cardiovascular disease) -     Lipid panel  Vitamin D deficiency -     VITAMIN D 25 Hydroxy (Vit-D Deficiency, Fractures)  Elevated PSA -     PSA -  Ambulatory referral to Urology  Impacted cerumen of left ear  Elevated glucose -     Hemoglobin A1c  Osteopenia of neck of femur, unspecified laterality -     calcium-vitamin D (OSCAL 500/200 D-3) 500-200 MG-UNIT tablet; Take 1 tablet by mouth 2 (two) times daily.  Increased prostate specific antigen (PSA) velocity -     Ambulatory referral to Urology   I have discontinued Willys E. Kirchner's traMADol. I am also having him start on calcium-vitamin D. Additionally, I am having him maintain his co-enzyme Q-10 and amLODipine.  Meds ordered this encounter  Medications  . amLODipine (NORVASC) 5 MG tablet    Sig: TAKE 1 TABLET(5 MG) BY MOUTH DAILY    Dispense:  90 tablet    Refill:  1  . calcium-vitamin D (OSCAL 500/200 D-3) 500-200 MG-UNIT tablet    Sig: Take 1 tablet by mouth 2 (two) times daily.    Dispense:  180 tablet    Refill:  5   Advised the patient that he needs at least 1200 mg of calcium daily.  May recommend a Os-Cal pill for this pending the results of his  current vitamin D level.  Rechecking PSA looking for PSA velocity.  Blood sugar slightly elevated last visit we will follow-up on this with a hemoglobin A1c.  Follow-up of the above will pending results of ordered blood work.  Follow-up: Return will use debrox daily and fu in 4-5 days for irrigation.Libby Maw, MD

## 2018-06-19 DIAGNOSIS — L7682 Other postprocedural complications of skin and subcutaneous tissue: Secondary | ICD-10-CM | POA: Diagnosis not present

## 2018-06-19 DIAGNOSIS — D485 Neoplasm of uncertain behavior of skin: Secondary | ICD-10-CM | POA: Diagnosis not present

## 2018-06-24 ENCOUNTER — Encounter: Payer: Self-pay | Admitting: Family Medicine

## 2018-06-24 ENCOUNTER — Ambulatory Visit: Payer: BLUE CROSS/BLUE SHIELD | Admitting: Family Medicine

## 2018-06-24 VITALS — BP 126/80 | HR 65 | Temp 97.5°F | Ht 71.0 in | Wt 171.0 lb

## 2018-06-24 DIAGNOSIS — H6123 Impacted cerumen, bilateral: Secondary | ICD-10-CM | POA: Diagnosis not present

## 2018-06-24 DIAGNOSIS — R972 Elevated prostate specific antigen [PSA]: Secondary | ICD-10-CM | POA: Diagnosis not present

## 2018-06-24 NOTE — Addendum Note (Signed)
Addended by: Jon Billings on: 06/24/2018 01:13 PM   Modules accepted: Level of Service

## 2018-06-24 NOTE — Progress Notes (Signed)
Subjective:  Patient ID: Collin Garza, male    DOB: 1951-07-19  Age: 67 y.o. MRN: [id number]  CC: ear irrigation   HPI Collin Garza presents for follow-up of his bilateral cerumen impaction.  He has been using Debrox for the last several days.  His hearing has been affected.  He does avoid using Q-tips as directed.  He has an ongoing history of cerumen impaction.  He has tried home irrigation without much result.  He is also here to follow-up on his PSA velocity.  There is been a steady rise in his PSA over the last few years.  He does have some symptoms of LUTS.  He is taking saw palmetto with some benefit.  He is taking Centrum Silver with vitamin D as well as Os-Cal with vitamin D.  Outpatient Medications Prior to Visit  Medication Sig Dispense Refill  . amLODipine (NORVASC) 5 MG tablet TAKE 1 TABLET(5 MG) BY MOUTH DAILY 90 tablet 1  . calcium-vitamin D (OSCAL 500/200 D-3) 500-200 MG-UNIT tablet Take 1 tablet by mouth 2 (two) times daily. 180 tablet 5  . co-enzyme Q-10 30 MG capsule Take 300 mg by mouth every other day.      No facility-administered medications prior to visit.     ROS Review of Systems  HENT: Positive for hearing loss. Negative for ear discharge and ear pain.   Respiratory: Negative.   Cardiovascular: Negative.   Gastrointestinal: Negative.   Genitourinary: Negative for difficulty urinating, frequency and urgency.  Neurological: Negative.   Hematological: Does not bruise/bleed easily.  Psychiatric/Behavioral: Negative.     Objective:  BP 126/80   Pulse 65   Temp (!) 97.5 F (36.4 C)   Ht 5\' 11"  (1.803 m)   Wt 171 lb (77.6 kg)   SpO2 98%   BMI 23.85 kg/m   BP Readings from Last 3 Encounters:  06/24/18 126/80  06/17/18 130/80  12/18/17 128/80    Wt Readings from Last 3 Encounters:  06/24/18 171 lb (77.6 kg)  06/17/18 171 lb (77.6 kg)  12/18/17 176 lb 4 oz (79.9 kg)    Physical Exam  Constitutional: He is oriented to person, place, and time.  He appears well-developed and well-nourished. No distress.  HENT:  Head: Normocephalic and atraumatic.  Right Ear: External ear normal. A foreign body is present.  Left Ear: External ear normal. A foreign body is present.  Eyes: Right eye exhibits no discharge. Left eye exhibits no discharge. No scleral icterus.  Neck: No JVD present. No tracheal deviation present.  Pulmonary/Chest: Effort normal.  Neurological: He is alert and oriented to person, place, and time.  Skin: Skin is warm and dry. He is not diaphoretic.  Psychiatric: He has a normal mood and affect. His behavior is normal.    Procedure note: Both ears were irrigated with a mixture of water and H2 O2 for several minutes.  Large chunks of wax were then removed using a curette.  Patient tolerated the procedure well.  There was no significant pain or discomfort.  He reported immediate improvement with his hearing. Lab Results  Component Value Date   WBC 7.8 06/17/2018   HGB 16.4 06/17/2018   HCT 47.3 06/17/2018   PLT 227.0 06/17/2018   GLUCOSE 112 (H) 06/17/2018   CHOL 237 (H) 06/17/2018   TRIG 125.0 06/17/2018   HDL 77.10 06/17/2018   LDLCALC 135 (H) 06/17/2018   ALT 14 06/17/2018   AST 18 06/17/2018   NA 134 (L) 06/17/2018  K 4.9 06/17/2018   CL 97 06/17/2018   CREATININE 0.89 06/17/2018   BUN 14 06/17/2018   CO2 26 06/17/2018   TSH 0.81 12/12/2017   PSA 4.74 (H) 06/17/2018   HGBA1C 5.3 06/17/2018    Dg Bone Density  Result Date: 12/30/2017 Date of study: 12/24/17 Exam: DUAL X-RAY ABSORPTIOMETRY (DXA) FOR BONE MINERAL DENSITY (BMD) Instrument: [employer] Provider: PCP Indication: follow up for low BMD Comparison: none (please note that it is not possible to compare data from different instruments) Clinical data: Pt is a 67 y.o. male with previous history of fracture. On calcium and vitamin D. Results:  Lumbar spine L1-L4 Femoral neck (FN) 33% distal radius T-score 2.0 RFN: -1.2 LFN: -1.5 n/a  Change in BMD from previous DXA test (%) n/a n/a n/a (*) statistically significant Assessment: the BMD is low according to the [facility] classification for osteoporosis (see below). Fracture risk: moderate FRAX score: 10 year major osteoporotic risk: 19.6%. 10 year hip fracture risk: 1.6%. The thresholds for treatment are 20% and 3%, respectively. Comments: the technical quality of the study is good. Degenerative changes and scoliosis may falsely increase spine score. Evaluation for secondary causes should be considered if clinically indicated. Recommend optimizing calcium (1200 mg/day) and vitamin D (800 IU/day) intake. Followup: Repeat BMD is appropriate after 2 years or after 1-2 years if starting treatment. WHO criteria for diagnosis of osteoporosis in postmenopausal women and in men 39 y/o or older: - normal: T-score -1.0 to + 1.0 - osteopenia/low bone density: T-score between -2.5 and -1.0 - osteoporosis: T-score below -2.5 - severe osteoporosis: T-score below -2.5 with history of fragility fracture Note: although not part of the WHO classification, the presence of a fragility fracture, regardless of the T-score, should be considered diagnostic of osteoporosis, provided other causes for the fracture have been excluded. Treatment: The National Osteoporosis Foundation recommends that treatment be considered in postmenopausal women and men age 41 or older with: 1. Hip or vertebral (clinical or morphometric) fracture 2. T-score of - 2.5 or lower at the spine or hip 3. 10-year fracture probability by FRAX of at least 20% for a major osteoporotic fracture and 3% for a hip fracture [name]    Assessment & Plan:   Sylvester was seen today for ear irrigation.  Diagnoses and all orders for this visit:  Increased prostate specific antigen (PSA) velocity  Bilateral hearing loss due to cerumen impaction   I am having Zyrion E. Kruzel maintain his co-enzyme Q-10, amLODipine, and calcium-vitamin D.  No orders of  the defined types were placed in this encounter.  Discussed PSA velocity with the patient.  He agrees with the referral for urology follow-up.  Discussed proper ear hygiene and avoidance of Q-tips which only served to push wax further into the canal.  Follow-up is in approximately 6 months.  Follow-up: No follow-ups on file.  Libby Maw, MD

## 2018-07-25 DIAGNOSIS — R972 Elevated prostate specific antigen [PSA]: Secondary | ICD-10-CM | POA: Diagnosis not present

## 2018-08-27 DIAGNOSIS — H40053 Ocular hypertension, bilateral: Secondary | ICD-10-CM | POA: Diagnosis not present

## 2018-09-26 DIAGNOSIS — Z23 Encounter for immunization: Secondary | ICD-10-CM | POA: Diagnosis not present

## 2018-10-15 DIAGNOSIS — R972 Elevated prostate specific antigen [PSA]: Secondary | ICD-10-CM | POA: Diagnosis not present

## 2018-10-22 DIAGNOSIS — R972 Elevated prostate specific antigen [PSA]: Secondary | ICD-10-CM | POA: Diagnosis not present

## 2018-12-25 ENCOUNTER — Encounter: Payer: Self-pay | Admitting: Family Medicine

## 2018-12-25 ENCOUNTER — Ambulatory Visit (INDEPENDENT_AMBULATORY_CARE_PROVIDER_SITE_OTHER): Payer: BLUE CROSS/BLUE SHIELD | Admitting: Family Medicine

## 2018-12-25 VITALS — BP 136/80 | HR 86 | Ht 71.0 in | Wt 171.2 lb

## 2018-12-25 DIAGNOSIS — R972 Elevated prostate specific antigen [PSA]: Secondary | ICD-10-CM | POA: Diagnosis not present

## 2018-12-25 DIAGNOSIS — I10 Essential (primary) hypertension: Secondary | ICD-10-CM

## 2018-12-25 DIAGNOSIS — E559 Vitamin D deficiency, unspecified: Secondary | ICD-10-CM

## 2018-12-25 DIAGNOSIS — E782 Mixed hyperlipidemia: Secondary | ICD-10-CM | POA: Diagnosis not present

## 2018-12-25 DIAGNOSIS — Z659 Problem related to unspecified psychosocial circumstances: Secondary | ICD-10-CM

## 2018-12-25 DIAGNOSIS — M85859 Other specified disorders of bone density and structure, unspecified thigh: Secondary | ICD-10-CM

## 2018-12-25 DIAGNOSIS — K4091 Unilateral inguinal hernia, without obstruction or gangrene, recurrent: Secondary | ICD-10-CM

## 2018-12-25 DIAGNOSIS — Z23 Encounter for immunization: Secondary | ICD-10-CM

## 2018-12-25 LAB — LIPID PANEL
Cholesterol: 253 mg/dL — ABNORMAL HIGH (ref 0–200)
HDL: 77.5 mg/dL (ref >39.00)
LDL Cholesterol: 156 mg/dL — ABNORMAL HIGH (ref 0–99)
NonHDL: 175.21
TRIGLYCERIDES: 97 mg/dL (ref 0.0–149.0)
Total CHOL/HDL Ratio: 3
VLDL: 19.4 mg/dL (ref 0.0–40.0)

## 2018-12-25 LAB — CBC
HEMATOCRIT: 49.2 % (ref 39.0–52.0)
HEMOGLOBIN: 16.9 g/dL (ref 13.0–17.0)
MCHC: 34.3 g/dL (ref 30.0–36.0)
MCV: 94 fl (ref 78.0–100.0)
PLATELETS: 248 10*3/uL (ref 150.0–400.0)
RBC: 5.23 Mil/uL (ref 4.22–5.81)
RDW: 13.1 % (ref 11.5–15.5)
WBC: 9.6 10*3/uL (ref 4.0–10.5)

## 2018-12-25 LAB — COMPREHENSIVE METABOLIC PANEL
ALBUMIN: 5 g/dL (ref 3.5–5.2)
ALK PHOS: 51 U/L (ref 39–117)
ALT: 14 U/L (ref 0–53)
AST: 18 U/L (ref 0–37)
BILIRUBIN TOTAL: 0.8 mg/dL (ref 0.2–1.2)
BUN: 11 mg/dL (ref 6–23)
CALCIUM: 10.3 mg/dL (ref 8.4–10.5)
CO2: 28 mEq/L (ref 19–32)
Chloride: 99 mEq/L (ref 96–112)
Creatinine, Ser: 0.84 mg/dL (ref 0.40–1.50)
GFR: 91.62 mL/min (ref >60.00)
GLUCOSE: 107 mg/dL — AB (ref 70–99)
POTASSIUM: 4.2 meq/L (ref 3.5–5.1)
Sodium: 136 mEq/L (ref 135–145)
Total Protein: 7.5 g/dL (ref 6.0–8.3)

## 2018-12-25 LAB — URINALYSIS, ROUTINE W REFLEX MICROSCOPIC
BILIRUBIN URINE: NEGATIVE
Hgb urine dipstick: NEGATIVE
LEUKOCYTES UA: NEGATIVE
Nitrite: NEGATIVE
PH: 5 (ref 5.0–8.0)
RBC / HPF: NONE SEEN (ref 0–?)
Specific Gravity, Urine: 1.02 (ref 1.000–1.030)
Total Protein, Urine: NEGATIVE
URINE GLUCOSE: NEGATIVE
UROBILINOGEN UA: 0.2 (ref 0.0–1.0)

## 2018-12-25 LAB — LDL CHOLESTEROL, DIRECT: LDL DIRECT: 145 mg/dL

## 2018-12-25 LAB — PSA: PSA: 5.41 ng/mL — ABNORMAL HIGH (ref 0.10–4.00)

## 2018-12-25 LAB — VITAMIN D 25 HYDROXY (VIT D DEFICIENCY, FRACTURES): VITD: 38.54 ng/mL (ref 30.00–100.00)

## 2018-12-25 MED ORDER — AMLODIPINE BESYLATE 5 MG PO TABS: ORAL_TABLET | ORAL | 1 refills | Status: DC

## 2018-12-25 MED ORDER — CALCIUM CARBONATE-VITAMIN D 500-200 MG-UNIT PO TABS: 1.0000 | ORAL_TABLET | Freq: Two times a day (BID) | ORAL | 5 refills | Status: AC

## 2018-12-25 NOTE — Progress Notes (Signed)
Established Patient Office Visit  Subjective:  Patient ID: Collin Garza, male    DOB: December 16, 1950  Age: 68 y.o. MRN: [id number]  CC:  Chief Complaint  Patient presents with  . Follow-up  . Annual Exam    HPI Collin Garza presents for here for follow-up of his hypertension, vitamin D deficiency and elevated PSA.  Blood pressure well controlled with the Norvasc.  He is having no swelling in his lower extremities or other side effects.  Continues to supplement with vitamin D and Os-Cal.  Did see urology for his elevated PSA and they found a lower PSA.  He was released from their care and back to mine.  Has a bulge in his left inguinal area.  He has had a hernia repair in this area in the past.  Special note his 94 year old grandson has what sounds to be extensive psychiatric illness.  They are looking into placing him into a group home.  More recently has refused to go to school.  His mother patient's daughter is incarcerated in the state of [place] on drug related issues.  Past Medical History:  Diagnosis Date  . Arthritis    LEFT HIP AND SHOULDER  . Depression   . Hypertension     Past Surgical History:  Procedure Laterality Date  . COLONOSCOPY    . INGUINAL HERNIA REPAIR     x3    Family History  Problem Relation Age of Onset  . Breast cancer Maternal Aunt   . Diabetes Maternal Uncle   . Heart disease Maternal Uncle   . Breast cancer Maternal Grandmother   . Pancreatic cancer Maternal Grandmother   . Cancer Maternal Grandmother   . Prostate cancer Cousin   . Stomach cancer Paternal Aunt   . Depression Mother   . Early death Mother   . Heart attack Mother   . Cancer Father   . COPD Father   . Hearing loss Father   . Arthritis Brother   . Alcohol abuse Daughter   . Depression Daughter   . Diabetes Daughter   . COPD Paternal Grandmother   . Hearing loss Brother   . Heart attack Brother   . Alcohol abuse Brother   . Asthma Brother   . COPD Brother   . Depression  Brother   . Colon cancer Neg Hx   . Celiac disease Neg Hx   . Cirrhosis Neg Hx   . Clotting disorder Neg Hx   . Colitis Neg Hx   . Colon polyps Neg Hx   . Crohn's disease Neg Hx   . Cystic fibrosis Neg Hx   . Esophageal cancer Neg Hx   . Hemochromatosis Neg Hx   . Inflammatory bowel disease Neg Hx   . Irritable bowel syndrome Neg Hx   . Kidney disease Neg Hx   . Liver cancer Neg Hx   . Liver disease Neg Hx   . Ovarian cancer Neg Hx   . Rectal cancer Neg Hx   . Ulcerative colitis Neg Hx   . Uterine cancer Neg Hx   . Wilson's disease Neg Hx     Social History   Socioeconomic History  . Marital status: Married    Spouse name: Not on file  . Number of children: Not on file  . Years of education: Not on file  . Highest education level: Not on file  Occupational History  . Not on file  Social Needs  . Financial resource strain: Not  on file  . Food insecurity:    Worry: Not on file    Inability: Not on file  . Transportation needs:    Medical: Not on file    Non-medical: Not on file  Tobacco Use  . Smoking status: Former Research scientist (life sciences)  . Smokeless tobacco: Never Used  . Tobacco comment: quit when i was in the service  Substance and Sexual Activity  . Alcohol use: Yes    Alcohol/week: 7.0 - 9.0 standard drinks    Types: 7 - 9 Cans of beer per week  . Drug use: No  . Sexual activity: Not on file  Lifestyle  . Physical activity:    Days per week: Not on file    Minutes per session: Not on file  . Stress: Not on file  Relationships  . Social connections:    Talks on phone: Not on file    Gets together: Not on file    Attends religious service: Not on file    Active member of club or organization: Not on file    Attends meetings of clubs or organizations: Not on file    Relationship status: Not on file  . Intimate partner violence:    Fear of current or ex partner: Not on file    Emotionally abused: Not on file    Physically abused: Not on file    Forced sexual  activity: Not on file  Other Topics Concern  . Not on file  Social History Narrative  . Not on file    Outpatient Medications Prior to Visit  Medication Sig Dispense Refill  . co-enzyme Q-10 30 MG capsule Take 300 mg by mouth every other day.     [name] amLODipine (NORVASC) 5 MG tablet TAKE 1 TABLET(5 MG) BY MOUTH DAILY 90 tablet 1  . calcium-vitamin D (OSCAL 500/200 D-3) 500-200 MG-UNIT tablet Take 1 tablet by mouth 2 (two) times daily. 180 tablet 5   No facility-administered medications prior to visit.     Allergies  Allergen Reactions  . Lisinopril Cough  . Penicillins   . Propoxyphene N-Acetaminophen     ROS Review of Systems  Constitutional: Negative for chills, diaphoresis, fatigue, fever and unexpected weight change.  HENT: Negative.   Eyes: Negative for photophobia and visual disturbance.  Respiratory: Negative.   Cardiovascular: Negative.   Gastrointestinal: Negative.   Endocrine: Negative for polyphagia and polyuria.  Musculoskeletal: Negative for arthralgias and myalgias.  Allergic/Immunologic: Negative for immunocompromised state.  Neurological: Negative for seizures, light-headedness and numbness.  Hematological: Does not bruise/bleed easily.  Psychiatric/Behavioral: Negative.       Objective:    Physical Exam  Constitutional: He is oriented to person, place, and time. He appears well-developed and well-nourished. No distress.  HENT:  Head: Normocephalic and atraumatic.  Right Ear: External ear normal.  Left Ear: External ear normal.  Mouth/Throat: Oropharynx is clear and moist. No oropharyngeal exudate.  Eyes: Pupils are equal, round, and reactive to light. Conjunctivae are normal. Right eye exhibits no discharge. Left eye exhibits no discharge. No scleral icterus.  Neck: Neck supple. No JVD present. No tracheal deviation present. No thyromegaly present.  Cardiovascular: Normal rate, regular rhythm and normal heart sounds.  Pulmonary/Chest: Effort normal  and breath sounds normal.  Abdominal: Soft. Bowel sounds are normal. A hernia is present. Hernia confirmed positive in the left inguinal area. Hernia confirmed negative in the right inguinal area.  Genitourinary:    Penis and rectum normal.  Rectum:  Guaiac result negative.     No rectal mass, anal fissure, tenderness, external hemorrhoid, internal hemorrhoid or abnormal anal tone.  Prostate is enlarged. Prostate is not tender. Right testis shows no mass, no swelling and no tenderness. Right testis is descended. Left testis shows no mass, no swelling and no tenderness. Left testis is descended. Circumcised. No hypospadias, penile erythema or penile tenderness. No discharge found.  Lymphadenopathy:    He has no cervical adenopathy.       Right: No inguinal adenopathy present.       Left: No inguinal adenopathy present.  Neurological: He is alert and oriented to person, place, and time.  Skin: Skin is warm and dry. He is not diaphoretic.  Psychiatric: He has a normal mood and affect. His behavior is normal.    BP 136/80   Pulse 86   Ht 5\' 11"  (1.803 m)   Wt 171 lb 4 oz (77.7 kg)   SpO2 97%   BMI 23.88 kg/m  Wt Readings from Last 3 Encounters:  12/25/18 171 lb 4 oz (77.7 kg)  06/24/18 171 lb (77.6 kg)  06/17/18 171 lb (77.6 kg)   BP Readings from Last 3 Encounters:  12/25/18 136/80  06/24/18 126/80  06/17/18 130/80   Guideline developer:  UpToDate (see UpToDate for funding source) Date Released: June 2014  There are no preventive care reminders to display for this patient.  There are no preventive care reminders to display for this patient.  Lab Results  Component Value Date   TSH 0.81 12/12/2017   Lab Results  Component Value Date   WBC 7.8 06/17/2018   HGB 16.4 06/17/2018   HCT 47.3 06/17/2018   MCV 94.7 06/17/2018   PLT 227.0 06/17/2018   Lab Results  Component Value Date   NA 134 (L) 06/17/2018   K 4.9 06/17/2018   CO2 26 06/17/2018   GLUCOSE 112 (H)  06/17/2018   BUN 14 06/17/2018   CREATININE 0.89 06/17/2018   BILITOT 1.3 (H) 06/17/2018   ALKPHOS 50 06/17/2018   AST 18 06/17/2018   ALT 14 06/17/2018   PROT 7.2 06/17/2018   ALBUMIN 4.9 06/17/2018   CALCIUM 10.2 06/17/2018   GFR 91.24 06/17/2018   Lab Results  Component Value Date   CHOL 237 (H) 06/17/2018   Lab Results  Component Value Date   HDL 77.10 06/17/2018   Lab Results  Component Value Date   LDLCALC 135 (H) 06/17/2018   Lab Results  Component Value Date   TRIG 125.0 06/17/2018   Lab Results  Component Value Date   CHOLHDL 3 06/17/2018   Lab Results  Component Value Date   HGBA1C 5.3 06/17/2018      Assessment & Plan:   Problem List Items Addressed This Visit      Cardiovascular and Mediastinum   Essential hypertension - Primary   Relevant Medications   amLODipine (NORVASC) 5 MG tablet   Other Relevant Orders   CBC   Comprehensive metabolic panel   Urinalysis, Routine w reflex microscopic     Musculoskeletal and Integument   Osteopenia of neck of femur   Relevant Medications   calcium-vitamin D (OSCAL 500/200 D-3) 500-200 MG-UNIT tablet     Other   HYPERLIPIDEMIA   Relevant Medications   amLODipine (NORVASC) 5 MG tablet   Other Relevant Orders   LDL cholesterol, direct   Lipid panel   Vitamin D deficiency   Relevant Orders   VITAMIN D 25 Hydroxy (Vit-D Deficiency, Fractures)  Elevated PSA   Relevant Orders   PSA   Unilateral recurrent inguinal hernia without obstruction or gangrene   Other social stressor    Other Visit Diagnoses    Need for 23-polyvalent pneumococcal polysaccharide vaccine       Relevant Orders   Pneumococcal polysaccharide vaccine 23-valent greater than or equal to 2yo subcutaneous/IM (Completed)      Meds ordered this encounter  Medications  . amLODipine (NORVASC) 5 MG tablet    Sig: TAKE 1 TABLET(5 MG) BY MOUTH DAILY    Dispense:  90 tablet    Refill:  1  . calcium-vitamin D (OSCAL 500/200 D-3)  500-200 MG-UNIT tablet    Sig: Take 1 tablet by mouth 2 (two) times daily.    Dispense:  180 tablet    Refill:  5    Follow-up: Return in about 6 months (around 06/25/2019).   Patient was given information on dosing Shingrix vaccine.  He was also given information on mindfulness.

## 2018-12-25 NOTE — Patient Instructions (Addendum)
Mindfulness-Based Stress Reduction Mindfulness-based stress reduction (MBSR) is a program that helps people learn to practice mindfulness. Mindfulness is the practice of intentionally paying attention to the present moment. It can be learned and practiced through techniques such as education, breathing exercises, meditation, and yoga. MBSR includes several mindfulness techniques in one program. MBSR works best when you understand the treatment, are willing to try new things, and can commit to spending time practicing what you learn. MBSR training may include learning about:  How your emotions, thoughts, and reactions affect your body.  New ways to respond to things that cause negative thoughts to start (triggers).  How to notice your thoughts and let go of them.  Practicing awareness of everyday things that you normally do without thinking.  The techniques and goals of different types of meditation. What are the benefits of MBSR? MBSR can have many benefits, which include helping you to:  Develop self-awareness. This refers to knowing and understanding yourself.  Learn skills and attitudes that help you to participate in your own health care.  Learn new ways to care for yourself.  Be more accepting about how things are, and let things go.  Be less judgmental and approach things with an open mind.  Be patient with yourself and trust yourself more. MBSR has also been shown to:  Reduce negative emotions, such as depression and anxiety.  Improve memory and focus.  Change how you sense and approach pain.  Boost your body's ability to fight infections.  Help you connect better with other people.  Improve your sense of well-being. Follow these instructions at home:   Find a local in-person or online MBSR program.  Set aside some time regularly for mindfulness practice.  Find a mindfulness practice that works best for you. This may include one or more of the  following: ? Meditation. Meditation involves focusing your mind on a certain thought or activity. ? Breathing awareness exercises. These help you to stay present by focusing on your breath. ? Body scan. For this practice, you lie down and pay attention to each part of your body from head to toe. You can identify tension and soreness and intentionally relax parts of your body. ? Yoga. Yoga involves stretching and breathing, and it can improve your ability to move and be flexible. It can also provide an experience of testing your body's limits, which can help you release stress. ? Mindful eating. This way of eating involves focusing on the taste, texture, color, and smell of each bite of food. Because this slows down eating and helps you feel full sooner, it can be an important part of a weight-loss plan.  Find a podcast or recording that provides guidance for breathing awareness, body scan, or meditation exercises. You can listen to these any time when you have a free moment to rest without distractions.  Follow your treatment plan as told by your health care provider. This may include taking regular medicines and making changes to your diet or lifestyle as recommended. How to practice mindfulness To do a basic awareness exercise:  Find a comfortable place to sit.  Pay attention to the present moment. Observe your thoughts, feelings, and surroundings just as they are.  Avoid placing judgment on yourself, your feelings, or your surroundings. Make note of any judgment that comes up, and let it go.  Your mind may wander, and that is okay. Make note of when your thoughts drift, and return your attention to the present moment. To do   basic mindfulness meditation:  Find a comfortable place to sit. This may include a stable chair or a firm floor cushion. ? Sit upright with your back straight. Let your arms fall next to your side with your hands resting on your legs. ? If sitting in a chair, rest your  feet flat on the floor. ? If sitting on a cushion, cross your legs in front of you.  Keep your head in a neutral position with your chin dropped slightly. Relax your jaw and rest the tip of your tongue on the roof of your mouth. Drop your gaze to the floor. You can close your eyes if you like.  Breathe normally and pay attention to your breath. Feel the air moving in and out of your nose. Feel your belly expanding and relaxing with each breath.  Your mind may wander, and that is okay. Make note of when your thoughts drift, and return your attention to your breath.  Avoid placing judgment on yourself, your feelings, or your surroundings. Make note of any judgment or feelings that come up, let them go, and bring your attention back to your breath.  When you are ready, lift your gaze or open your eyes. Pay attention to how your body feels after the meditation. Where to find more information You can find more information about MBSR from:  Your health care provider.  Community-based meditation centers or programs.  Programs offered near you. Summary  Mindfulness-based stress reduction (MBSR) is a program that teaches you how to intentionally pay attention to the present moment. It is used with other treatments to help you cope better with daily stress, emotions, and pain.  MBSR focuses on developing self-awareness, which allows you to respond to life stress without judgment or negative emotions.  MBSR programs may involve learning different mindfulness practices, such as breathing exercises, meditation, yoga, body scan, or mindful eating. Find a mindfulness practice that works best for you, and set aside time for it on a regular basis. This information is not intended to replace advice given to you by your health care provider. Make sure you discuss any questions you have with your health care provider. Document Released: 03/29/2017 Document Revised: 03/29/2017 Document Reviewed:  03/29/2017 Elsevier Interactive Patient Education  2019 [place] Virus Vaccine Live injection What is this medicine? VARICELLA VIRUS VACCINE (var uh SEL uh VAHY ruhs vak SEEN) is used to prevent infections of chickenpox. HERPES ZOSTER VIRUS VACCINE (HUR peez ZOS ter vahy ruhs vak SEEN) is used to prevent shingles in adults 66 years old and over. This vaccine is not used to treat shingles or nerve pain from shingles. These medicines may be used for other purposes; ask your health care provider or pharmacist if you have questions. This medicine may be used for other purposes; ask your health care provider or pharmacist if you have questions. COMMON BRAND NAME(S): Varivax, Zostavax What should I tell my health care provider before I take this medicine? They need to know if you have any of the following conditions: -blood disorders or disease -cancer like leukemia or lymphoma -immune system problems or therapy -infection with fever -recent immune globulin therapy -tuberculosis -an unusual or allergic reaction to vaccines, neomycin, gelatin, other medicines, foods, dyes, or preservatives -pregnant or trying to get pregnant -breast-feeding How should I use this medicine? These vaccines are for injection under the skin. They are given by a health care professional. A copy of Vaccine Information Statements will be given before each varicella  virus vaccination. Read this sheet carefully each time. The sheet may change frequently. A Vaccine Information Statement is not given before the herpes zoster virus vaccine. Talk to your pediatrician regarding the use of the varicella virus vaccine in children. While this drug may be prescribed for children as young as 63 months of age for selected conditions, precautions do apply. The herpes zoster virus vaccine is not approved in children. Overdosage: If you think you have taken too much of this medicine contact a poison control center or  emergency room at once. NOTE: This medicine is only for you. Do not share this medicine with others. What if I miss a dose? Keep appointments for follow-up (booster) doses of varicella virus vaccine as directed. It is important not to miss your dose. Call your doctor or health care professional if you are unable to keep an appointment. Follow-up (booster) doses are not needed for the herpes zoster virus vaccine. What may interact with this medicine? Do not take these medicines with any of the following medications: -adalimumab -anakinra -etanercept -infliximab -medicines that suppress your immune system -medicines to treat cancer These medicines may also interact with the following medications: -aspirin and aspirin-like medicines (varicella virus vaccine only) -blood transfusions (varicella virus vaccine only) -immunoglobulins (varicella virus vaccine only) -steroid medicines like prednisone or cortisone This list may not describe all possible interactions. Give your health care provider a list of all the medicines, herbs, non-prescription drugs, or dietary supplements you use. Also tell them if you smoke, drink alcohol, or use illegal drugs. Some items may interact with your medicine. What should I watch for while using this medicine? Visit your doctor for regular check ups. These vaccines, like all vaccines, may not fully protect everyone. After receiving these vaccines it may be possible to pass chickenpox infection to others. For up to 6 weeks, avoid people with immune system problems, pregnant women who have not had chickenpox, newborns of women who have not had chickenpox, and all newborns born at less than 28 weeks of pregnancy. Talk to your doctor for more information. Do not become pregnant for 3 months after taking these vaccines. Women should inform their doctor if they wish to become pregnant or think they might be pregnant. There is a potential for serious side effects to an  unborn child. Talk to your health care professional or pharmacist for more information. What side effects may I notice from receiving this medicine? Side effects that you should report to your doctor or health care professional as soon as possible: -allergic reactions like skin rash, itching or hives, swelling of the face, lips, or tongue -breathing problems -extreme changes in behavior -feeling faint or lightheaded, falls -fever over 102 degrees F -pain, tingling, numbness in the hands or feet -redness, blistering, peeling or loosening of the skin, including inside the mouth -seizures -unusually weak or tired Side effects that usually do not require medical attention (report to your doctor or health care professional if they continue or are bothersome): -aches or pains -chickenpox-like rash -diarrhea -headache -low-grade fever under 102 degrees F -loss of appetite -nausea, vomiting -redness, pain, swelling at site where injected -sleepy -trouble sleeping This list may not describe all possible side effects. Call your doctor for medical advice about side effects. You may report side effects to FDA at 1-800-FDA-1088. Where should I keep my medicine? These drugs are given in a hospital or clinic and will not be stored at home. NOTE: This sheet is a summary. It may not  cover all possible information. If you have questions about this medicine, talk to your doctor, pharmacist, or health care provider.  2019 Elsevier/Gold Standard (2013-07-25 14:24:35) Zoster Vaccine, Recombinant injection What is this medicine? ZOSTER VACCINE (ZOS ter vak SEEN) is used to prevent shingles in adults 66 years old and over. This vaccine is not used to treat shingles or nerve pain from shingles. This medicine may be used for other purposes; ask your health care provider or pharmacist if you have questions. COMMON BRAND NAME(S): [facility] What should I tell my health care provider before I take this  medicine? They need to know if you have any of these conditions: -blood disorders or disease -cancer like leukemia or lymphoma -immune system problems or therapy -an unusual or allergic reaction to vaccines, other medications, foods, dyes, or preservatives -pregnant or trying to get pregnant -breast-feeding How should I use this medicine? This vaccine is for injection in a muscle. It is given by a health care professional. Talk to your pediatrician regarding the use of this medicine in children. This medicine is not approved for use in children. Overdosage: If you think you have taken too much of this medicine contact a poison control center or emergency room at once. NOTE: This medicine is only for you. Do not share this medicine with others. What if I miss a dose? Keep appointments for follow-up (booster) doses as directed. It is important not to miss your dose. Call your doctor or health care professional if you are unable to keep an appointment. What may interact with this medicine? -medicines that suppress your immune system -medicines to treat cancer -steroid medicines like prednisone or cortisone This list may not describe all possible interactions. Give your health care provider a list of all the medicines, herbs, non-prescription drugs, or dietary supplements you use. Also tell them if you smoke, drink alcohol, or use illegal drugs. Some items may interact with your medicine. What should I watch for while using this medicine? Visit your doctor for regular check ups. This vaccine, like all vaccines, may not fully protect everyone. What side effects may I notice from receiving this medicine? Side effects that you should report to your doctor or health care professional as soon as possible: -allergic reactions like skin rash, itching or hives, swelling of the face, lips, or tongue -breathing problems Side effects that usually do not require medical attention (report these to your  doctor or health care professional if they continue or are bothersome): -chills -headache -fever -nausea, vomiting -redness, warmth, pain, swelling or itching at site where injected -tiredness This list may not describe all possible side effects. Call your doctor for medical advice about side effects. You may report side effects to FDA at 1-800-FDA-1088. Where should I keep my medicine? This vaccine is only given in a clinic, pharmacy, doctor's office, or other health care setting and will not be stored at home. NOTE: This sheet is a summary. It may not cover all possible information. If you have questions about this medicine, talk to your doctor, pharmacist, or health care provider.  2019 Elsevier/Gold Standard (2017-07-02 13:20:30)

## 2018-12-31 ENCOUNTER — Other Ambulatory Visit: Payer: Self-pay | Admitting: Family Medicine

## 2018-12-31 DIAGNOSIS — I10 Essential (primary) hypertension: Secondary | ICD-10-CM

## 2019-06-15 IMAGING — DX DG RIBS W/ CHEST 3+V*R*
3 series · 3 of 3 positions shown · non-contrast
Comparison: Chest radiograph 09/08/2016

CLINICAL DATA: RIGHT posterior rib pain post fall

EXAM:
RIGHT RIBS AND CHEST - 3+ VIEW

[chest pa]
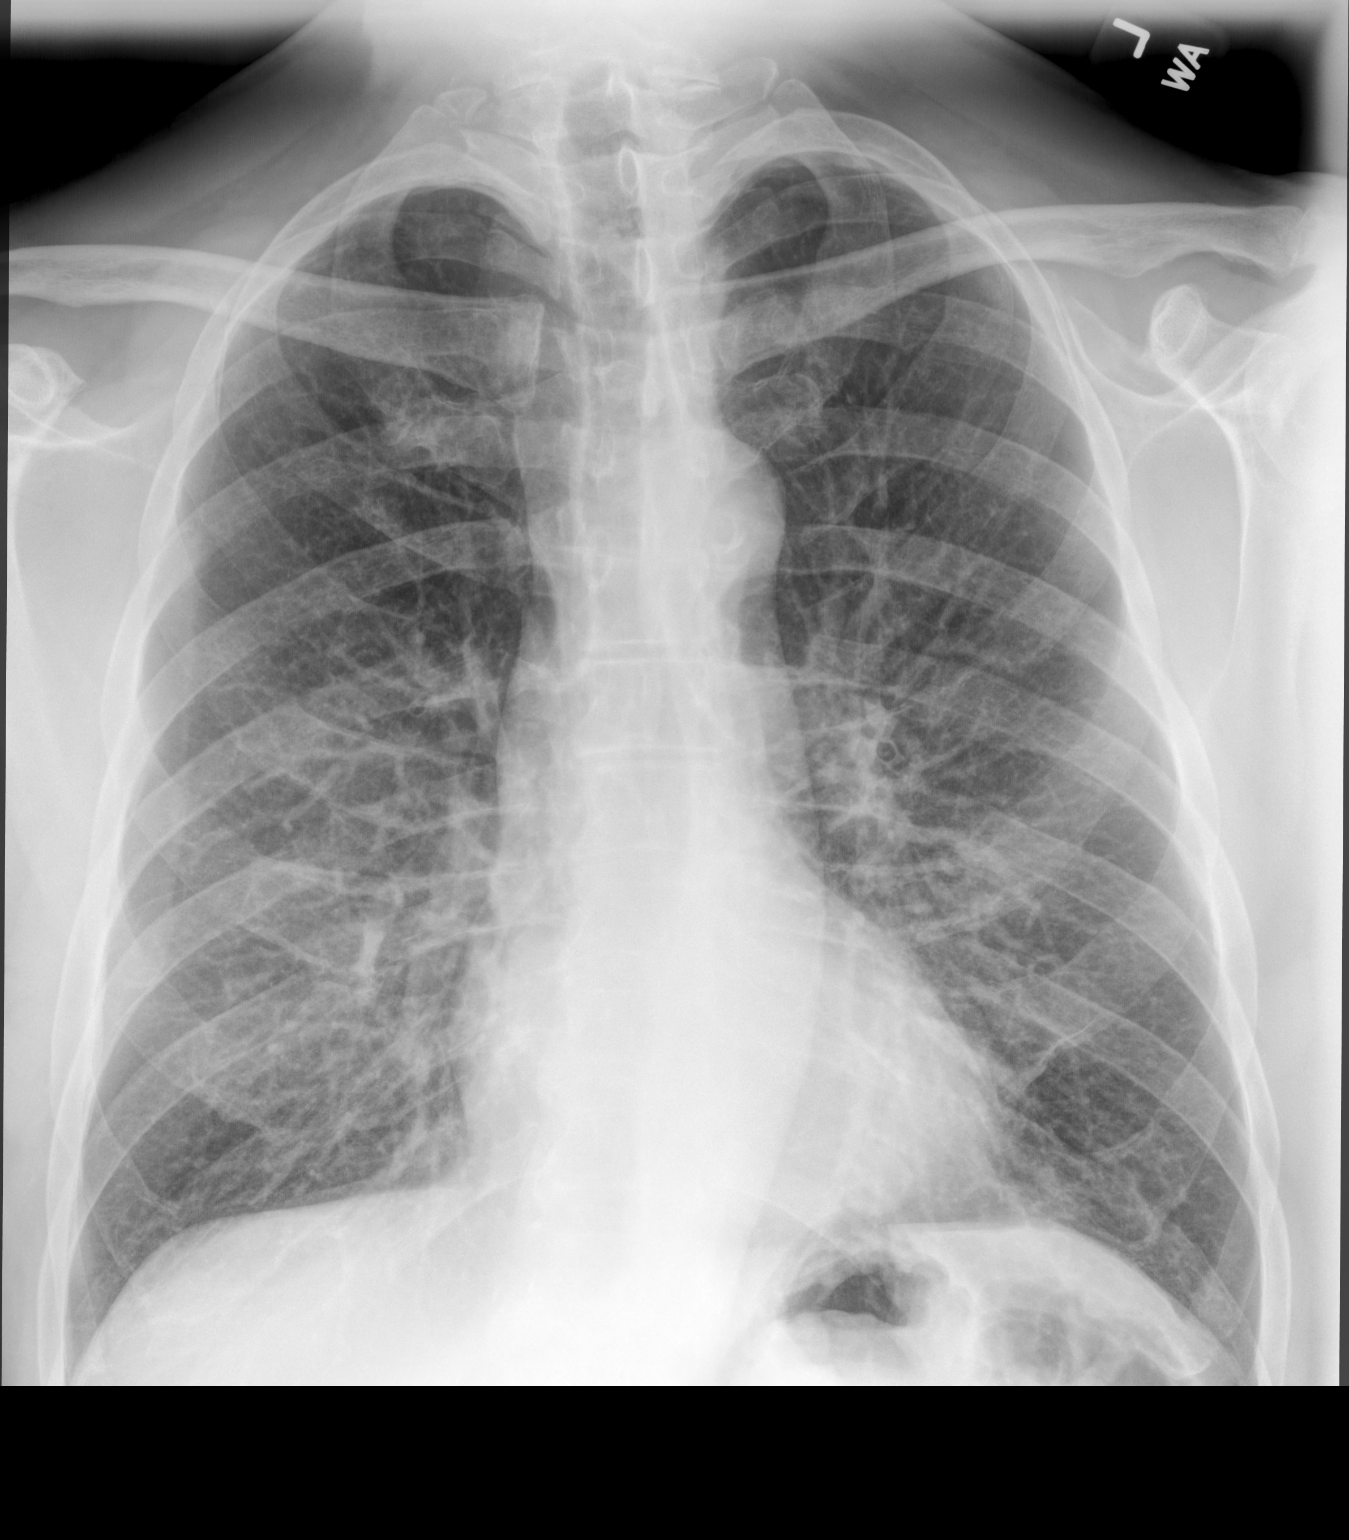

[hemithorax (ribs) pa]
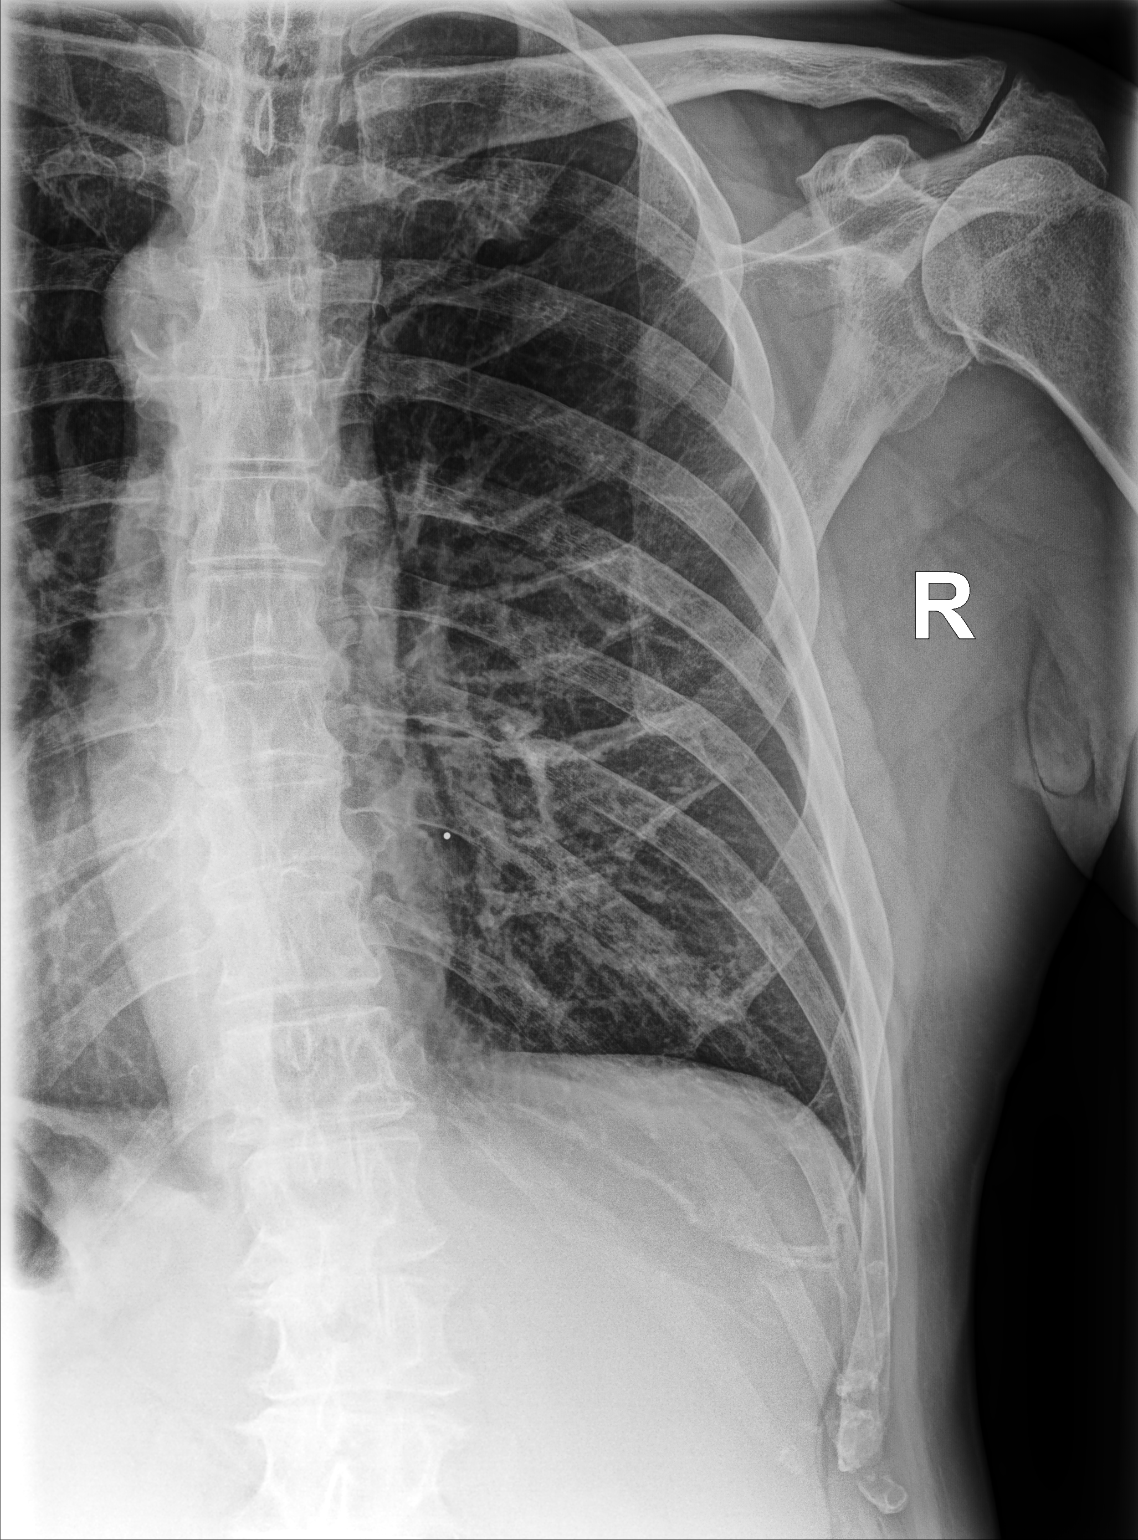

[hemithorax (ribs) mlo]
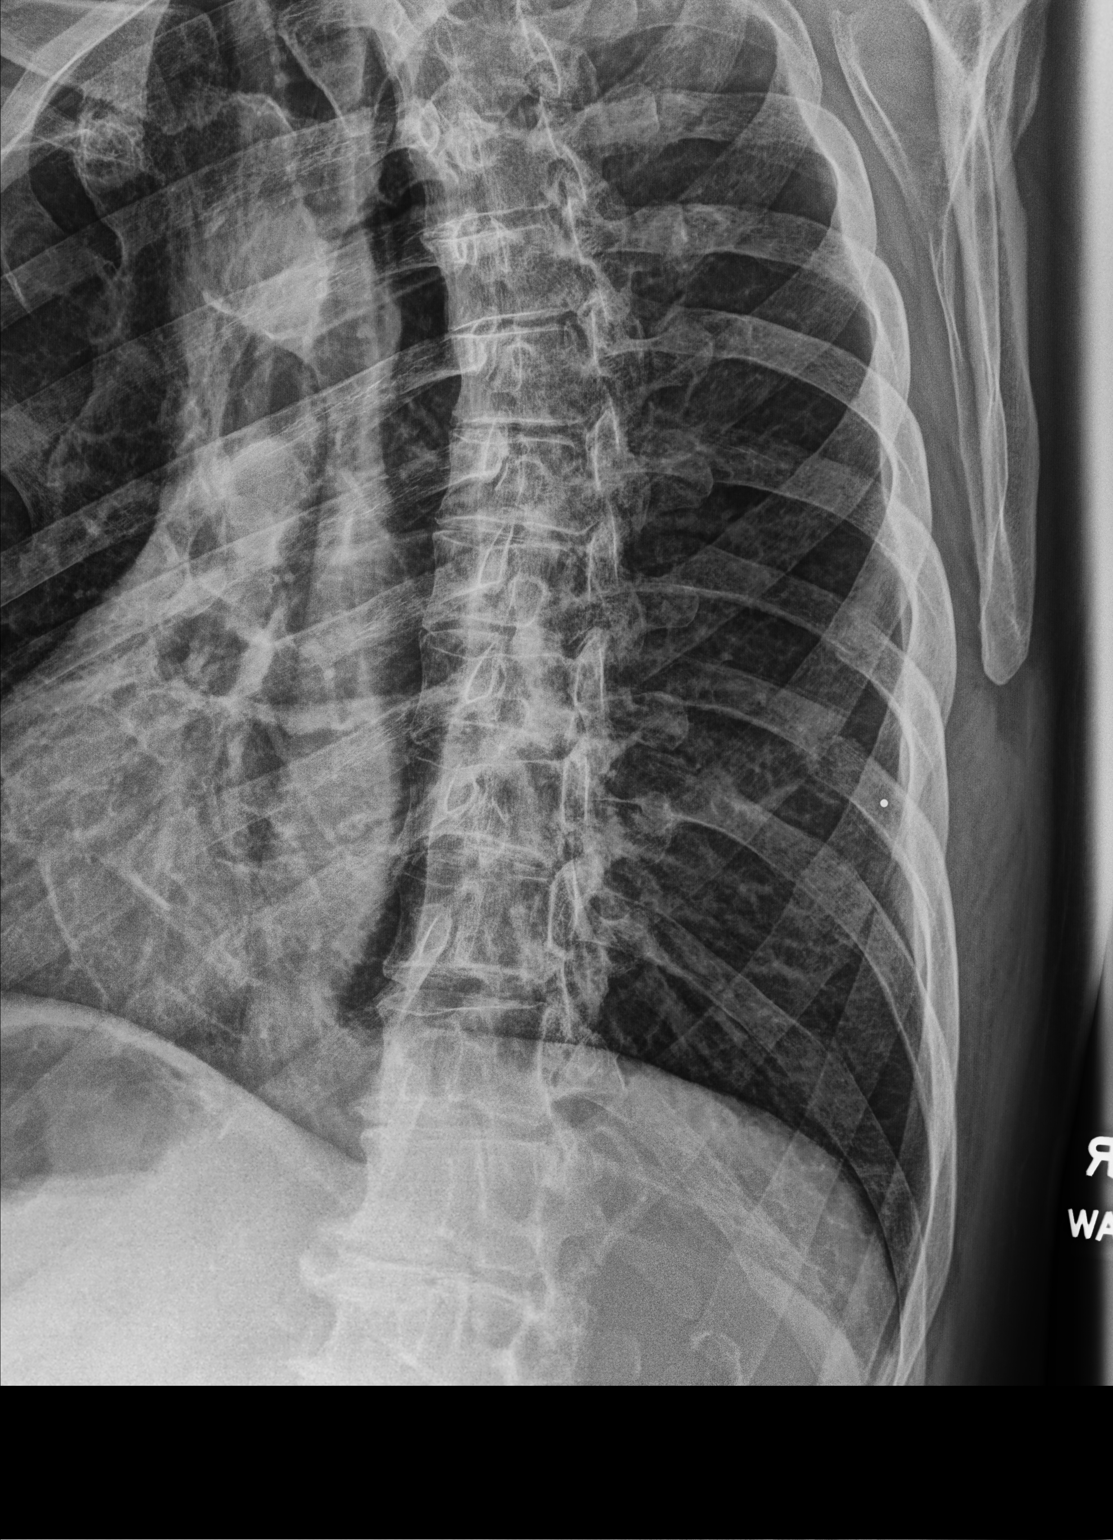

[3 of 3 positions shown; findings below may reference images not displayed]

FINDINGS: Normal heart size, mediastinal contours, and pulmonary vascularity.

Atherosclerotic calcification aorta.

Bronchitic changes without infiltrate, pleural effusion or
pneumothorax.

Bones appear slightly demineralized.

BB placed at site of symptoms at the posterior lower RIGHT chest.

Displaced fractures of the RIGHT eighth and ninth ribs identified.

No other definite bony abnormalities.
IMPRESSION: Bronchitic changes.

Minimally displaced fractures of the posterior RIGHT eighth and
ninth ribs.

## 2019-06-27 ENCOUNTER — Other Ambulatory Visit: Payer: Self-pay | Admitting: Family Medicine

## 2019-06-27 DIAGNOSIS — I10 Essential (primary) hypertension: Secondary | ICD-10-CM

## 2019-09-28 ENCOUNTER — Other Ambulatory Visit: Payer: Self-pay | Admitting: Family Medicine

## 2019-09-28 DIAGNOSIS — I10 Essential (primary) hypertension: Secondary | ICD-10-CM

## 2020-01-05 ENCOUNTER — Ambulatory Visit (INDEPENDENT_AMBULATORY_CARE_PROVIDER_SITE_OTHER): Payer: BC Managed Care – PPO | Admitting: Family Medicine

## 2020-01-05 ENCOUNTER — Encounter: Payer: Self-pay | Admitting: Family Medicine

## 2020-01-05 ENCOUNTER — Other Ambulatory Visit: Payer: Self-pay

## 2020-01-05 VITALS — BP 134/78 | HR 67 | Temp 95.6°F | Ht 70.0 in | Wt 171.6 lb

## 2020-01-05 DIAGNOSIS — E559 Vitamin D deficiency, unspecified: Secondary | ICD-10-CM

## 2020-01-05 DIAGNOSIS — Z20822 Contact with and (suspected) exposure to covid-19: Secondary | ICD-10-CM

## 2020-01-05 DIAGNOSIS — E782 Mixed hyperlipidemia: Secondary | ICD-10-CM

## 2020-01-05 DIAGNOSIS — Z23 Encounter for immunization: Secondary | ICD-10-CM | POA: Diagnosis not present

## 2020-01-05 DIAGNOSIS — R972 Elevated prostate specific antigen [PSA]: Secondary | ICD-10-CM

## 2020-01-05 DIAGNOSIS — H6122 Impacted cerumen, left ear: Secondary | ICD-10-CM

## 2020-01-05 DIAGNOSIS — I10 Essential (primary) hypertension: Secondary | ICD-10-CM

## 2020-01-05 DIAGNOSIS — D229 Melanocytic nevi, unspecified: Secondary | ICD-10-CM

## 2020-01-05 LAB — LIPID PANEL
Cholesterol: 205 mg/dL — ABNORMAL HIGH (ref 0–200)
HDL: 67 mg/dL (ref >39.00)
LDL Cholesterol: 121 mg/dL — ABNORMAL HIGH (ref 0–99)
NonHDL: 137.93
Total CHOL/HDL Ratio: 3
Triglycerides: 87 mg/dL (ref 0.0–149.0)
VLDL: 17.4 mg/dL (ref 0.0–40.0)

## 2020-01-05 LAB — URINALYSIS, ROUTINE W REFLEX MICROSCOPIC
Bilirubin Urine: NEGATIVE
Hgb urine dipstick: NEGATIVE
Ketones, ur: NEGATIVE
Leukocytes,Ua: NEGATIVE
Nitrite: NEGATIVE
RBC / HPF: NONE SEEN (ref 0–?)
Specific Gravity, Urine: 1.02 (ref 1.000–1.030)
Total Protein, Urine: NEGATIVE
Urine Glucose: NEGATIVE
Urobilinogen, UA: 0.2 (ref 0.0–1.0)
WBC, UA: NONE SEEN (ref 0–?)
pH: 6 (ref 5.0–8.0)

## 2020-01-05 LAB — CBC
HCT: 44.9 % (ref 39.0–52.0)
Hemoglobin: 15.5 g/dL (ref 13.0–17.0)
MCHC: 34.4 g/dL (ref 30.0–36.0)
MCV: 95.1 fl (ref 78.0–100.0)
Platelets: 222 10*3/uL (ref 150.0–400.0)
RBC: 4.73 Mil/uL (ref 4.22–5.81)
RDW: 13 % (ref 11.5–15.5)
WBC: 7.6 10*3/uL (ref 4.0–10.5)

## 2020-01-05 LAB — COMPREHENSIVE METABOLIC PANEL
ALT: 13 U/L (ref 0–53)
AST: 15 U/L (ref 0–37)
Albumin: 4.5 g/dL (ref 3.5–5.2)
Alkaline Phosphatase: 53 U/L (ref 39–117)
BUN: 14 mg/dL (ref 6–23)
CO2: 26 mEq/L (ref 19–32)
Calcium: 9.6 mg/dL (ref 8.4–10.5)
Chloride: 102 mEq/L (ref 96–112)
Creatinine, Ser: 0.78 mg/dL (ref 0.40–1.50)
GFR: 99.48 mL/min (ref >60.00)
Glucose, Bld: 107 mg/dL — ABNORMAL HIGH (ref 70–99)
Potassium: 3.9 mEq/L (ref 3.5–5.1)
Sodium: 138 mEq/L (ref 135–145)
Total Bilirubin: 0.6 mg/dL (ref 0.2–1.2)
Total Protein: 6.7 g/dL (ref 6.0–8.3)

## 2020-01-05 LAB — VITAMIN D 25 HYDROXY (VIT D DEFICIENCY, FRACTURES): VITD: 27.9 ng/mL — ABNORMAL LOW (ref 30.00–100.00)

## 2020-01-05 LAB — PSA: PSA: 4.2 ng/mL — ABNORMAL HIGH (ref 0.10–4.00)

## 2020-01-05 NOTE — Patient Instructions (Signed)
Atherosclerosis  Atherosclerosis is narrowing and hardening of the arteries. Arteries are blood vessels that carry blood from the heart to all parts of the body. This blood contains oxygen. Arteries can become narrow or clogged with a buildup of fat, cholesterol, calcium, and other substances (plaque). Plaque decreases the amount of blood that can flow through the artery. Atherosclerosis can affect any artery in the body, including:  Heart arteries (coronary artery disease). This may cause a heart attack.  Brain arteries. This may cause a stroke (cerebrovascular accident).  Leg, arm, and pelvis arteries (peripheral artery disease). This may cause pain and numbness.  Kidney arteries. This may cause kidney (renal) failure. Treatment may slow the disease and prevent further damage to the heart, brain, peripheral arteries, and kidneys. What are the causes? Atherosclerosis develops slowly over many years. The inner layers of your arteries become damaged and allow the gradual buildup of plaque. The exact cause of atherosclerosis is not fully understood. Symptoms of atherosclerosis do not occur until the artery becomes narrow or blocked. What increases the risk? The following factors may make you more likely to develop this condition:  High blood pressure.  High cholesterol.  Being middle-aged or older.  Having a family history of atherosclerosis.  Having high blood fats (triglycerides).  Diabetes.  Being overweight.  Smoking tobacco.  Not exercising enough (sedentary lifestyle).  Having a substance in the blood called C-reactive protein (CRP). This is a sign of increased levels of inflammation in the body.  Sleep apnea.  Being stressed.  Drinking too much alcohol. What are the signs or symptoms? This condition may not cause any symptoms. If you have symptoms, they are caused by damage to an area of your body that is not getting enough blood.  Coronary artery disease may cause  chest pain and shortness of breath.  Decreased blood supply to your brain may cause a stroke. Signs of a stroke may include sudden: ? Weakness on one side of the body. ? Confusion. ? Changes in vision. ? Inability to speak or understand speech. ? Loss of balance, coordination, or the ability to walk. ? Severe headache. ? Loss of consciousness.  Peripheral arterial disease may cause pain and numbness, often in the legs and hips.  Renal failure may cause fatigue, nausea, swelling, and itchy skin. How is this diagnosed? This condition is diagnosed based on your medical history and a physical exam. During the exam:  Your health care provider will: ? Check your pulse in different places. ? Listen for a "whooshing" sound over your arteries (bruit).  You may have tests, such as: ? Blood tests to check your levels of cholesterol, triglycerides, and CRP. ? Electrocardiogram (ECG) to check for heart damage. ? Chest X-ray to see if you have an enlarged heart, which is a sign of heart failure. ? Stress test to see how your heart reacts to exercise. ? Echocardiogram to get images of the inside of your heart. ? Ankle-brachial index to compare blood pressure in your arms to blood pressure in your ankles. ? Ultrasound of your peripheral arteries to check blood flow. ? CT scan to check for damage to your heart or brain. ? X-rays of blood vessels after dye has been injected (angiogram) to check blood flow. How is this treated? Treatment starts with lifestyle changes, which may include:  Changing your diet.  Losing weight.  Reducing stress.  Exercising and being physically active more regularly.  Not smoking. You may also need medicine to:  Lower   triglycerides and cholesterol.  Control blood pressure.  Prevent blood clots.  Lower inflammation in your body.  Control your blood sugar. Sometimes, surgery is needed to:  Remove plaque from an artery (endarterectomy).  Open or widen  a narrowed heart artery (angioplasty).  Create a new path for your blood with one of these procedures: ? Heart (coronary) artery bypass graft surgery. ? Peripheral artery bypass graft surgery. Follow these instructions at home: Eating and drinking   Eat a heart-healthy diet. Talk with your health care provider or a diet and nutrition specialist (dietitian) if you need help. A heart-healthy diet involves: ? Limiting unhealthy fats and increasing healthy fats. Some examples of healthy fats are olive oil and canola oil. ? Eating plant-based foods, such as fruits, vegetables, nuts, whole grains, and legumes (such as peas and lentils).  Limit alcohol intake to no more than 1 drink a day for nonpregnant women and 2 drinks a day for men. One drink equals 12 oz of beer, 5 oz of wine, or 1 oz of hard liquor. Lifestyle  Follow an exercise program as told by your health care provider.  Maintain a healthy weight. Lose weight if your health care provider says that you need to do that.  Rest when you are tired.  Learn to manage your stress.  Do not use any products that contain nicotine or tobacco, such as cigarettes and e-cigarettes. If you need help quitting, ask your health care provider.  Do not abuse drugs. General instructions  Take over-the-counter and prescription medicines only as told by your health care provider.  Manage other health conditions as told by your health care provider.  Keep all follow-up visits as told by your health care provider. This is important. Contact a health care provider if:  You have chest pain or discomfort. This includes squeezing chest pain that may feel like indigestion (angina).  You have shortness of breath.  You have an irregular heartbeat.  You have unexplained fatigue.  You have unexplained pain or numbness in an arm, leg, or hip.  You have nausea, swelling of your hands or feet, and itchy skin. Get help right away if:  You have any  symptoms of a heart attack, such as: ? Chest pain. ? Shortness of breath. ? Pain in your neck, jaw, arms, back, or stomach. ? Cold sweat. ? Nausea. ? Light-headedness.  You have any symptoms of a stroke. "BE FAST" is an easy way to remember the main warning signs of a stroke: ? B - Balance. Signs are dizziness, sudden trouble walking, or loss of balance. ? E - Eyes. Signs are trouble seeing or a sudden change in vision. ? F - Face. Signs are sudden weakness or numbness of the face, or the face or eyelid drooping on one side. ? A - Arms. Signs are weakness or numbness in an arm. This happens suddenly and usually on one side of the body. ? S - Speech. Signs are sudden trouble speaking, slurred speech, or trouble understanding what people say. ? T - Time. Time to call emergency services. Write down what time symptoms started.  You have other signs of a stroke, such as: ? A sudden, severe headache with no known cause. ? Nausea or vomiting. ? Seizure. These symptoms may represent a serious problem that is an emergency. Do not wait to see if the symptoms will go away. Get medical help right away. Call your local emergency services (911 in the [place] Do not drive yourself   to the hospital. Summary  Atherosclerosis is narrowing and hardening of the arteries.  Arteries can become narrow or clogged with a buildup of fat, cholesterol, calcium, and other substances (plaque).  This condition may not cause any symptoms. If you do have symptoms, they are caused by damage to an area of your body that is not getting enough blood.  Treatment may include lifestyle changes and medicines. In some cases, surgery is needed. This information is not intended to replace advice given to you by your health care provider. Make sure you discuss any questions you have with your health care provider. Document Revised: 03/01/2018 Document Reviewed: 07/26/2017 Elsevier Patient Education  2020 [employer]  

## 2020-01-05 NOTE — Progress Notes (Addendum)
Established Patient Office Visit  Subjective:  Patient ID: Collin Garza, male    DOB: 12/14/1950  Age: 69 y.o. MRN: [id number]  CC:  Chief Complaint  Patient presents with  . Annual Exam    CPE, no concerns at this time    HPI Collin Garza presents for follow-up.  Blood pressure remains well controlled with amlodipine.  Continues to suffer from BPH symptoms including hesitancy pre and post void dribbling and nocturia x2-3. Saw urology and they remeasured PSA and found it to be normal. They offered treatment for bph and he declined for now.  Continues to take his vitamin D supplementation.  Has been working on the cerumen in his left ear but has been unable to irrigated.  Unable to see the dentist and the ophthalmologist this year.  Still dealing with his mentally challenged grandson.  He was exposed to his grandson who lives with him when the grandson had Covid.  Admits that the isolation of Covid with his wife has been tough this year.  Has avoided all appointments due to the pandemic.  Past Medical History:  Diagnosis Date  . Arthritis    LEFT HIP AND SHOULDER  . Depression   . Hypertension     Past Surgical History:  Procedure Laterality Date  . COLONOSCOPY    . INGUINAL HERNIA REPAIR     x3    Family History  Problem Relation Age of Onset  . Breast cancer Maternal Aunt   . Diabetes Maternal Uncle   . Heart disease Maternal Uncle   . Breast cancer Maternal Grandmother   . Pancreatic cancer Maternal Grandmother   . Cancer Maternal Grandmother   . Prostate cancer Cousin   . Stomach cancer Paternal Aunt   . Depression Mother   . Early death Mother   . Heart attack Mother   . Cancer Father   . COPD Father   . Hearing loss Father   . Arthritis Brother   . Alcohol abuse Daughter   . Depression Daughter   . Diabetes Daughter   . COPD Paternal Grandmother   . Hearing loss Brother   . Heart attack Brother   . Alcohol abuse Brother   . Asthma Brother   . COPD  Brother   . Depression Brother   . Colon cancer Neg Hx   . Celiac disease Neg Hx   . Cirrhosis Neg Hx   . Clotting disorder Neg Hx   . Colitis Neg Hx   . Colon polyps Neg Hx   . Crohn's disease Neg Hx   . Cystic fibrosis Neg Hx   . Esophageal cancer Neg Hx   . Hemochromatosis Neg Hx   . Inflammatory bowel disease Neg Hx   . Irritable bowel syndrome Neg Hx   . Kidney disease Neg Hx   . Liver cancer Neg Hx   . Liver disease Neg Hx   . Ovarian cancer Neg Hx   . Rectal cancer Neg Hx   . Ulcerative colitis Neg Hx   . Uterine cancer Neg Hx   . Wilson's disease Neg Hx     Social History   Socioeconomic History  . Marital status: Married    Spouse name: Not on file  . Number of children: Not on file  . Years of education: Not on file  . Highest education level: Not on file  Occupational History  . Not on file  Tobacco Use  . Smoking status: Former Research scientist (life sciences)  .  Smokeless tobacco: Never Used  . Tobacco comment: quit when i was in the service  Substance and Sexual Activity  . Alcohol use: Yes    Alcohol/week: 7.0 - 9.0 standard drinks    Types: 7 - 9 Cans of beer per week  . Drug use: No  . Sexual activity: Not on file  Other Topics Concern  . Not on file  Social History Narrative  . Not on file   Social Determinants of Health   Financial Resource Strain:   . Difficulty of Paying Living Expenses: Not on file  Food Insecurity:   . Worried About Charity fundraiser in the Last Year: Not on file  . Ran Out of Food in the Last Year: Not on file  Transportation Needs:   . Lack of Transportation (Medical): Not on file  . Lack of Transportation (Non-Medical): Not on file  Physical Activity:   . Days of Exercise per Week: Not on file  . Minutes of Exercise per Session: Not on file  Stress:   . Feeling of Stress : Not on file  Social Connections:   . Frequency of Communication with Friends and Family: Not on file  . Frequency of Social Gatherings with Friends and Family:  Not on file  . Attends Religious Services: Not on file  . Active Member of Clubs or Organizations: Not on file  . Attends Archivist Meetings: Not on file  . Marital Status: Not on file  Intimate Partner Violence:   . Fear of Current or Ex-Partner: Not on file  . Emotionally Abused: Not on file  . Physically Abused: Not on file  . Sexually Abused: Not on file    Outpatient Medications Prior to Visit  Medication Sig Dispense Refill  . amLODipine (NORVASC) 5 MG tablet TAKE 1 TABLET(5 MG) BY MOUTH DAILY 30 tablet 0  . calcium-vitamin D (OSCAL 500/200 D-3) 500-200 MG-UNIT tablet Take 1 tablet by mouth 2 (two) times daily. 180 tablet 5  . co-enzyme Q-10 30 MG capsule Take 300 mg by mouth every other day.      No facility-administered medications prior to visit.    Allergies  Allergen Reactions  . Lisinopril Cough  . Penicillins   . Propoxyphene N-Acetaminophen     ROS Review of Systems  Constitutional: Negative for chills, diaphoresis, fatigue, fever and unexpected weight change.  HENT: Negative.   Eyes: Negative for photophobia and visual disturbance.  Respiratory: Negative.   Cardiovascular: Negative.   Gastrointestinal: Negative.   Endocrine: Negative for polyphagia and polyuria.  Genitourinary: Positive for difficulty urinating and frequency.  Musculoskeletal: Negative for gait problem and joint swelling.  Skin: Negative for pallor and rash.  Allergic/Immunologic: Negative for immunocompromised state.  Neurological: Negative for light-headedness and numbness.  Hematological: Does not bruise/bleed easily.  Psychiatric/Behavioral: Negative.    Depression screen [facility] 2/9 01/05/2020 01/05/2020  Decreased Interest 0 0  Down, Depressed, Hopeless 0 0  PHQ - 2 Score 0 0  Altered sleeping 1 -  Tired, decreased energy 1 -  Change in appetite 0 -  Feeling bad or failure about yourself  1 -  Trouble concentrating 0 -  Moving slowly or fidgety/restless 0 -  Suicidal  thoughts 0 -  PHQ-9 Score 3 -      Objective:    Physical Exam  Constitutional: He is oriented to person, place, and time. He appears well-developed and well-nourished. No distress.  HENT:  Head: Normocephalic and atraumatic.  Right Ear: External  ear normal.  Left Ear: External ear normal.  Mouth/Throat: Oropharynx is clear and moist. No oropharyngeal exudate.  Eyes: Conjunctivae are normal. Right eye exhibits no discharge. Left eye exhibits no discharge. No scleral icterus.  Neck: No JVD present. No tracheal deviation present. No thyromegaly present.  Cardiovascular: Normal rate, regular rhythm and normal heart sounds.  Pulmonary/Chest: Effort normal and breath sounds normal. No stridor.  Abdominal: Bowel sounds are normal. He exhibits no distension. There is no abdominal tenderness. There is no rebound and no guarding.  Genitourinary: Rectum:     Guaiac result negative.     No rectal mass, anal fissure, tenderness, external hemorrhoid, internal hemorrhoid or abnormal anal tone.  Prostate is enlarged. Prostate is not tender.  Lymphadenopathy:    He has no cervical adenopathy.  Neurological: He is alert and oriented to person, place, and time.  Skin: Skin is warm and dry. He is not diaphoretic.  Psychiatric: He has a normal mood and affect. His behavior is normal.    BP 134/78   Pulse 67   Temp (!) 95.6 F (35.3 C) (Tympanic)   Ht 5\' 10"  (1.778 m)   Wt 171 lb 9.6 oz (77.8 kg)   SpO2 95%   BMI 24.62 kg/m  Wt Readings from Last 3 Encounters:  01/05/20 171 lb 9.6 oz (77.8 kg)  12/25/18 171 lb 4 oz (77.7 kg)  06/24/18 171 lb (77.6 kg)     There are no preventive care reminders to display for this patient.  There are no preventive care reminders to display for this patient.  Lab Results  Component Value Date   TSH 0.81 12/12/2017   Lab Results  Component Value Date   WBC 7.6 01/05/2020   HGB 15.5 01/05/2020   HCT 44.9 01/05/2020   MCV 95.1 01/05/2020   PLT 222.0  01/05/2020   Lab Results  Component Value Date   NA 138 01/05/2020   K 3.9 01/05/2020   CO2 26 01/05/2020   GLUCOSE 107 (H) 01/05/2020   BUN 14 01/05/2020   CREATININE 0.78 01/05/2020   BILITOT 0.6 01/05/2020   ALKPHOS 53 01/05/2020   AST 15 01/05/2020   ALT 13 01/05/2020   PROT 6.7 01/05/2020   ALBUMIN 4.5 01/05/2020   CALCIUM 9.6 01/05/2020   GFR 99.48 01/05/2020   Lab Results  Component Value Date   CHOL 205 (H) 01/05/2020   Lab Results  Component Value Date   HDL 67.00 01/05/2020   Lab Results  Component Value Date   LDLCALC 121 (H) 01/05/2020   Lab Results  Component Value Date   TRIG 87.0 01/05/2020   Lab Results  Component Value Date   CHOLHDL 3 01/05/2020   Lab Results  Component Value Date   HGBA1C 5.3 06/17/2018   The 10-year ASCVD risk score Mikey Bussing DC Jr., et al., 2013) is: 14.9%   Values used to calculate the score:     Age: 35 years     Sex: Male     Is Non-Hispanic African American: No     Diabetic: No     Tobacco smoker: No     Systolic Blood Pressure: Q000111Q mmHg     Is BP treated: Yes     HDL Cholesterol: 67 mg/dL     Total Cholesterol: 205 mg/dL   Assessment & Plan:   Problem List Items Addressed This Visit      Cardiovascular and Mediastinum   Essential hypertension - Primary   Relevant Medications  atorvastatin (LIPITOR) 20 MG tablet   Other Relevant Orders   CBC (Completed)   Comprehensive metabolic panel (Completed)   Urinalysis, Routine w reflex microscopic (Completed)     Nervous and Auditory   Impacted cerumen of left ear     Other   HYPERLIPIDEMIA   Relevant Medications   atorvastatin (LIPITOR) 20 MG tablet   Other Relevant Orders   Comprehensive metabolic panel (Completed)   Lipid panel (Completed)   Vitamin D deficiency   Relevant Medications   Vitamin D, Ergocalciferol, (DRISDOL) 1.25 MG (50000 UNIT) CAPS capsule   Other Relevant Orders   VITAMIN D 25 Hydroxy (Vit-D Deficiency, Fractures) (Completed)    Elevated PSA   Relevant Orders   PSA (Completed)   Close exposure to COVID-19 virus   Relevant Orders   SAR CoV2 Serology (COVID 19)AB(IGG)IA    Other Visit Diagnoses    Atypical nevus       Relevant Orders   Ambulatory referral to Dermatology   Need for pneumococcal vaccination       Relevant Orders   Pneumococcal conjugate vaccine 13-valent IM (Completed)      Meds ordered this encounter  Medications  . Vitamin D, Ergocalciferol, (DRISDOL) 1.25 MG (50000 UNIT) CAPS capsule    Sig: Take 1 capsule (50,000 Units total) by mouth every 7 (seven) days.    Dispense:  30 capsule    Refill:  0  . atorvastatin (LIPITOR) 20 MG tablet    Sig: Take 1 tablet (20 mg total) by mouth daily.    Dispense:  90 tablet    Refill:  1    Follow-up: Return in about 6 months (around 07/04/2020).  Information was given on vascular disease with his calcified aorta.  May need to treat his cholesterol.  He will return for ear irrigation.  Asked him to continue to use the Debrox.  Libby Maw, MD

## 2020-01-06 LAB — SAR COV2 SEROLOGY (COVID19)AB(IGG),IA: SARS CoV2 AB IGG: NEGATIVE

## 2020-01-06 MED ORDER — ATORVASTATIN CALCIUM 20 MG PO TABS: 20.0000 mg | ORAL_TABLET | Freq: Every day | ORAL | 1 refills | Status: DC

## 2020-01-06 MED ORDER — VITAMIN D (ERGOCALCIFEROL) 1.25 MG (50000 UNIT) PO CAPS: 50000.0000 [IU] | ORAL_CAPSULE | ORAL | 0 refills | Status: DC

## 2020-01-06 NOTE — Addendum Note (Signed)
Addended by: Jon Billings on: 01/06/2020 01:40 PM   Modules accepted: Orders

## 2020-02-02 ENCOUNTER — Ambulatory Visit: Payer: BC Managed Care – PPO | Attending: Internal Medicine

## 2020-02-02 DIAGNOSIS — Z23 Encounter for immunization: Secondary | ICD-10-CM | POA: Insufficient documentation

## 2020-02-02 NOTE — Progress Notes (Signed)
   Covid-19 Vaccination Clinic  Name:  Collin Garza    MRN: [id number] DOB: 1951/08/05  02/02/2020  [name] was observed post Covid-19 immunization for 15 minutes without incidence. He was provided with Vaccine Information Sheet and instruction to access the V-Safe system.   [name] was instructed to call 911 with any severe reactions post vaccine: [name] Difficulty breathing  . Swelling of your face and throat  . A fast heartbeat  . A bad rash all over your body  . Dizziness and weakness    Immunizations Administered    Name Date Dose VIS Date Route   Pfizer COVID-19 Vaccine 02/02/2020  9:39 AM 0.3 mL 11/14/2019 Intramuscular   Manufacturer: [place]   Lot: [id number]   [place]: [id number]

## 2020-02-25 DIAGNOSIS — D235 Other benign neoplasm of skin of trunk: Secondary | ICD-10-CM | POA: Diagnosis not present

## 2020-02-25 DIAGNOSIS — D225 Melanocytic nevi of trunk: Secondary | ICD-10-CM | POA: Diagnosis not present

## 2020-02-25 DIAGNOSIS — L821 Other seborrheic keratosis: Secondary | ICD-10-CM | POA: Diagnosis not present

## 2020-03-02 ENCOUNTER — Ambulatory Visit: Payer: BC Managed Care – PPO | Attending: Internal Medicine

## 2020-03-02 DIAGNOSIS — Z23 Encounter for immunization: Secondary | ICD-10-CM

## 2020-03-02 NOTE — Progress Notes (Signed)
   Covid-19 Vaccination Clinic  Name:  Collin Garza    MRN: [id number] DOB: [birth year]  03/02/2020  Mr. Collin Garza was observed post Covid-19 immunization for 15 minutes without incident. He was provided with Vaccine Information Sheet and instruction to access the V-Safe system.   Mr. Collin Garza was instructed to call 911 with any severe reactions post vaccine: [name] Difficulty breathing  . Swelling of face and throat  . A fast heartbeat  . A bad rash all over body  . Dizziness and weakness   Immunizations Administered    Name Date Dose VIS Date Route   Pfizer COVID-19 Vaccine 03/02/2020  9:38 AM 0.3 mL 11/14/2019 Intramuscular   Manufacturer: [place]   Lot: [id number]   [place]: [id number]

## 2020-04-03 ENCOUNTER — Other Ambulatory Visit: Payer: Self-pay | Admitting: Family Medicine

## 2020-04-03 DIAGNOSIS — I10 Essential (primary) hypertension: Secondary | ICD-10-CM

## 2020-04-14 DIAGNOSIS — H40013 Open angle with borderline findings, low risk, bilateral: Secondary | ICD-10-CM | POA: Diagnosis not present

## 2020-05-28 DIAGNOSIS — D485 Neoplasm of uncertain behavior of skin: Secondary | ICD-10-CM | POA: Diagnosis not present

## 2020-05-28 DIAGNOSIS — L821 Other seborrheic keratosis: Secondary | ICD-10-CM | POA: Diagnosis not present

## 2020-05-28 DIAGNOSIS — L820 Inflamed seborrheic keratosis: Secondary | ICD-10-CM | POA: Diagnosis not present

## 2020-07-05 ENCOUNTER — Ambulatory Visit: Payer: BC Managed Care – PPO | Admitting: Family Medicine

## 2020-07-05 ENCOUNTER — Other Ambulatory Visit: Payer: Self-pay

## 2020-07-05 ENCOUNTER — Encounter: Payer: Self-pay | Admitting: Family Medicine

## 2020-07-05 VITALS — BP 136/78 | HR 60 | Temp 97.0°F | Ht 70.0 in | Wt 196.2 lb

## 2020-07-05 DIAGNOSIS — N5201 Erectile dysfunction due to arterial insufficiency: Secondary | ICD-10-CM

## 2020-07-05 DIAGNOSIS — H6122 Impacted cerumen, left ear: Secondary | ICD-10-CM

## 2020-07-05 DIAGNOSIS — E559 Vitamin D deficiency, unspecified: Secondary | ICD-10-CM | POA: Diagnosis not present

## 2020-07-05 DIAGNOSIS — E782 Mixed hyperlipidemia: Secondary | ICD-10-CM

## 2020-07-05 DIAGNOSIS — I10 Essential (primary) hypertension: Secondary | ICD-10-CM | POA: Diagnosis not present

## 2020-07-05 LAB — LIPID PANEL
Cholesterol: 228 mg/dL — ABNORMAL HIGH (ref 0–200)
HDL: 69.8 mg/dL (ref >39.00)
LDL Cholesterol: 139 mg/dL — ABNORMAL HIGH (ref 0–99)
NonHDL: 158.39
Total CHOL/HDL Ratio: 3
Triglycerides: 98 mg/dL (ref 0.0–149.0)
VLDL: 19.6 mg/dL (ref 0.0–40.0)

## 2020-07-05 LAB — COMPREHENSIVE METABOLIC PANEL
ALT: 18 U/L (ref 0–53)
AST: 20 U/L (ref 0–37)
Albumin: 4.4 g/dL (ref 3.5–5.2)
Alkaline Phosphatase: 51 U/L (ref 39–117)
BUN: 18 mg/dL (ref 6–23)
CO2: 26 mEq/L (ref 19–32)
Calcium: 9.6 mg/dL (ref 8.4–10.5)
Chloride: 101 mEq/L (ref 96–112)
Creatinine, Ser: 0.84 mg/dL (ref 0.40–1.50)
GFR: 91.19 mL/min (ref >60.00)
Glucose, Bld: 104 mg/dL — ABNORMAL HIGH (ref 70–99)
Potassium: 4.1 mEq/L (ref 3.5–5.1)
Sodium: 135 mEq/L (ref 135–145)
Total Bilirubin: 0.8 mg/dL (ref 0.2–1.2)
Total Protein: 6.8 g/dL (ref 6.0–8.3)

## 2020-07-05 LAB — CBC
HCT: 43.8 % (ref 39.0–52.0)
Hemoglobin: 15.1 g/dL (ref 13.0–17.0)
MCHC: 34.4 g/dL (ref 30.0–36.0)
MCV: 93.7 fl (ref 78.0–100.0)
Platelets: 222 10*3/uL (ref 150.0–400.0)
RBC: 4.67 Mil/uL (ref 4.22–5.81)
RDW: 13.3 % (ref 11.5–15.5)
WBC: 7.9 10*3/uL (ref 4.0–10.5)

## 2020-07-05 LAB — LDL CHOLESTEROL, DIRECT: Direct LDL: 141 mg/dL

## 2020-07-05 LAB — VITAMIN D 25 HYDROXY (VIT D DEFICIENCY, FRACTURES): VITD: 42.17 ng/mL (ref 30.00–100.00)

## 2020-07-05 MED ORDER — SILDENAFIL CITRATE 20 MG PO TABS
ORAL_TABLET | ORAL | 1 refills | Status: DC
Start: 2020-07-05 — End: 2020-08-12

## 2020-07-05 MED ORDER — ATORVASTATIN CALCIUM 20 MG PO TABS: 20.0000 mg | ORAL_TABLET | Freq: Every day | ORAL | 1 refills | Status: DC

## 2020-07-05 MED ORDER — DEBROX 6.5 % OT SOLN: 5.0000 [drp] | Freq: Two times a day (BID) | OTIC | 0 refills | Status: DC

## 2020-07-05 NOTE — Patient Instructions (Addendum)
Earwax Buildup, Adult The ears produce a substance called earwax that helps keep bacteria out of the ear and protects the skin in the ear canal. Occasionally, earwax can build up in the ear and cause discomfort or hearing loss. What increases the risk? This condition is more likely to develop in people who:  Are male.  Are elderly.  Naturally produce more earwax.  Clean their ears often with cotton swabs.  Use earplugs often.  Use in-ear headphones often.  Wear hearing aids.  Have narrow ear canals.  Have earwax that is overly thick or sticky.  Have eczema.  Are dehydrated.  Have excess hair in the ear canal. What are the signs or symptoms? Symptoms of this condition include:  Reduced or muffled hearing.  A feeling of fullness in the ear or feeling that the ear is plugged.  Fluid coming from the ear.  Ear pain.  Ear itch.  Ringing in the ear.  Coughing.  An obvious piece of earwax that can be seen inside the ear canal. How is this diagnosed? This condition may be diagnosed based on:  Your symptoms.  Your medical history.  An ear exam. During the exam, your health care provider will look into your ear with an instrument called an otoscope. You may have tests, including a hearing test. How is this treated? This condition may be treated by:  Using ear drops to soften the earwax.  Having the earwax removed by a health care provider. The health care provider may: ? Flush the ear with water. ? Use an instrument that has a loop on the end (curette). ? Use a suction device.  Surgery to remove the wax buildup. This may be done in severe cases. Follow these instructions at home:   Take over-the-counter and prescription medicines only as told by your health care provider.  Do not put any objects, including cotton swabs, into your ear. You can clean the opening of your ear canal with a washcloth or facial tissue.  Follow instructions from your health care  provider about cleaning your ears. Do not over-clean your ears.  Drink enough fluid to keep your urine clear or pale yellow. This will help to thin the earwax.  Keep all follow-up visits as told by your health care provider. If earwax builds up in your ears often or if you use hearing aids, consider seeing your health care provider for routine, preventive ear cleanings. Ask your health care provider how often you should schedule your cleanings.  If you have hearing aids, clean them according to instructions from the manufacturer and your health care provider. Contact a health care provider if:  You have ear pain.  You develop a fever.  You have blood, pus, or other fluid coming from your ear.  You have hearing loss.  You have ringing in your ears that does not go away.  Your symptoms do not improve with treatment.  You feel like the room is spinning (vertigo). Summary  Earwax can build up in the ear and cause discomfort or hearing loss.  The most common symptoms of this condition include reduced or muffled hearing and a feeling of fullness in the ear or feeling that the ear is plugged.  This condition may be diagnosed based on your symptoms, your medical history, and an ear exam.  This condition may be treated by using ear drops to soften the earwax or by having the earwax removed by a health care provider.  Do not put any   objects, including cotton swabs, into your ear. You can clean the opening of your ear canal with a washcloth or facial tissue. This information is not intended to replace advice given to you by your health care provider. Make sure you discuss any questions you have with your health care provider. Document Revised: 11/02/2017 Document Reviewed: 01/31/2017 Elsevier Patient Education  [place].  High Cholesterol  High cholesterol is a condition in which the blood has high levels of a white, waxy, fat-like substance (cholesterol). The human body needs  small amounts of cholesterol. The liver makes all the cholesterol that the body needs. Extra (excess) cholesterol comes from the food that we eat. Cholesterol is carried from the liver by the blood through the blood vessels. If you have high cholesterol, deposits (plaques) may build up on the walls of your blood vessels (arteries). Plaques make the arteries narrower and stiffer. Cholesterol plaques increase your risk for heart attack and stroke. Work with your health care provider to keep your cholesterol levels in a healthy range. What increases the risk? This condition is more likely to develop in people who:  Eat foods that are high in animal fat (saturated fat) or cholesterol.  Are overweight.  Are not getting enough exercise.  Have a family history of high cholesterol. What are the signs or symptoms? There are no symptoms of this condition. How is this diagnosed? This condition may be diagnosed from the results of a blood test.  If you are older than age 16, your health care provider may check your cholesterol every 4-6 years.  You may be checked more often if you already have high cholesterol or other risk factors for heart disease. The blood test for cholesterol measures:  "Bad" cholesterol (LDL cholesterol). This is the main type of cholesterol that causes heart disease. The desired level for LDL is less than 100.  "Good" cholesterol (HDL cholesterol). This type helps to protect against heart disease by cleaning the arteries and carrying the LDL away. The desired level for HDL is 60 or higher.  Triglycerides. These are fats that the body can store or burn for energy. The desired number for triglycerides is lower than 150.  Total cholesterol. This is a measure of the total amount of cholesterol in your blood, including LDL cholesterol, HDL cholesterol, and triglycerides. A healthy number is less than 200. How is this treated? This condition is treated with diet changes, lifestyle  changes, and medicines. Diet changes  This may include eating more whole grains, fruits, vegetables, nuts, and fish.  This may also include cutting back on red meat and foods that have a lot of added sugar. Lifestyle changes  Changes may include getting at least 40 minutes of aerobic exercise 3 times a week. Aerobic exercises include walking, biking, and swimming. Aerobic exercise along with a healthy diet can help you maintain a healthy weight.  Changes may also include quitting smoking. Medicines  Medicines are usually given if diet and lifestyle changes have failed to reduce your cholesterol to healthy levels.  Your health care provider may prescribe a statin medicine. Statin medicines have been shown to reduce cholesterol, which can reduce the risk of heart disease. Follow these instructions at home: Eating and drinking If told by your health care provider:  Eat chicken (without skin), fish, veal, shellfish, ground [place] breast, and round or loin cuts of red meat.  Do not eat fried foods or fatty meats, such as hot dogs and salami.  Eat plenty  of fruits, such as apples.  Eat plenty of vegetables, such as broccoli, potatoes, and carrots.  Eat beans, peas, and lentils.  Eat grains such as barley, rice, couscous, and bulgur wheat.  Eat pasta without cream sauces.  Use skim or nonfat milk, and eat low-fat or nonfat yogurt and cheeses.  Do not eat or drink whole milk, cream, ice cream, egg yolks, or hard cheeses.  Do not eat stick margarine or tub margarines that contain trans fats (also called partially hydrogenated oils).  Do not eat saturated tropical oils, such as coconut oil and palm oil.  Do not eat cakes, cookies, crackers, or other baked goods that contain trans fats.  General instructions  Exercise as directed by your health care provider. Increase your activity level with activities such as gardening, walking, and taking the stairs.  Take over-the-counter and  prescription medicines only as told by your health care provider.  Do not use any products that contain nicotine or tobacco, such as cigarettes and e-cigarettes. If you need help quitting, ask your health care provider.  Keep all follow-up visits as told by your health care provider. This is important. Contact a health care provider if:  You are struggling to maintain a healthy diet or weight.  You need help to start on an exercise program.  You need help to stop smoking. Get help right away if:  You have chest pain.  You have trouble breathing. This information is not intended to replace advice given to you by your health care provider. Make sure you discuss any questions you have with your health care provider. Document Revised: 11/23/2017 Document Reviewed: 05/20/2016 Elsevier Patient Education  [place]. Sildenafil tablets (Erectile Dysfunction) What is this medicine? SILDENAFIL (sil DEN a fil) is used to treat erection problems in men. This medicine may be used for other purposes; ask your health care provider or pharmacist if you have questions. COMMON BRAND NAME(S): Viagra What should I tell my health care provider before I take this medicine? They need to know if you have any of these conditions:  bleeding disorders  eye or vision problems, including a rare inherited eye disease called retinitis pigmentosa  anatomical deformation of the penis, Peyronie's disease, or history of priapism (painful and prolonged erection)  heart disease, angina, a history of heart attack, irregular heart beats, or other heart problems  high or low blood pressure  history of blood diseases, like sickle cell anemia or leukemia  history of stomach bleeding  kidney disease  liver disease  stroke  an unusual or allergic reaction to sildenafil, other medicines, foods, dyes, or preservatives  pregnant or trying to get pregnant  breast-feeding How should I use this  medicine? Take this medicine by mouth with a glass of water. Follow the directions on the prescription label. The dose is usually taken 1 hour before sexual activity. You should not take the dose more than once per day. Do not take your medicine more often than directed. Talk to your pediatrician regarding the use of this medicine in children. This medicine is not used in children for this condition. Overdosage: If you think you have taken too much of this medicine contact a poison control center or emergency room at once. NOTE: This medicine is only for you. Do not share this medicine with others. What if I miss a dose? This does not apply. Do not take double or extra doses. What may interact with this medicine? Do not take this medicine with  any of the following medications:  cisapride  nitrates like amyl nitrite, isosorbide dinitrate, isosorbide mononitrate, nitroglycerin  riociguat This medicine may also interact with the following medications:  antiviral medicines for HIV or AIDS  bosentan  certain medicines for benign prostatic hyperplasia (BPH)  certain medicines for blood pressure  certain medicines for fungal infections like ketoconazole and itraconazole  cimetidine  erythromycin  rifampin This list may not describe all possible interactions. Give your health care provider a list of all the medicines, herbs, non-prescription drugs, or dietary supplements you use. Also tell them if you smoke, drink alcohol, or use illegal drugs. Some items may interact with your medicine. What should I watch for while using this medicine? If you notice any changes in your vision while taking this drug, call your doctor or health care professional as soon as possible. Stop using this medicine and call your health care provider right away if you have a loss of sight in one or both eyes. Contact your doctor or health care professional right away if you have an erection that lasts longer than 4  hours or if it becomes painful. This may be a sign of a serious problem and must be treated right away to prevent permanent damage. If you experience symptoms of nausea, dizziness, chest pain or arm pain upon initiation of sexual activity after taking this medicine, you should refrain from further activity and call your doctor or health care professional as soon as possible. Do not drink alcohol to excess (examples, 5 glasses of wine or 5 shots of whiskey) when taking this medicine. When taken in excess, alcohol can increase your chances of getting a headache or getting dizzy, increasing your heart rate or lowering your blood pressure. Using this medicine does not protect you or your partner against HIV infection (the virus that causes AIDS) or other sexually transmitted diseases. What side effects may I notice from receiving this medicine? Side effects that you should report to your doctor or health care professional as soon as possible:  allergic reactions like skin rash, itching or hives, swelling of the face, lips, or tongue  breathing problems  changes in hearing  changes in vision  chest pain  fast, irregular heartbeat  prolonged or painful erection  seizures Side effects that usually do not require medical attention (report to your doctor or health care professional if they continue or are bothersome):  back pain  dizziness  flushing  headache  indigestion  muscle aches  nausea  stuffy or runny nose This list may not describe all possible side effects. Call your doctor for medical advice about side effects. You may report side effects to FDA at 1-800-FDA-1088. Where should I keep my medicine? Keep out of reach of children. Store at room temperature between 15 and 30 degrees C (59 and 86 degrees F). Throw away any unused medicine after the expiration date. NOTE: This sheet is a summary. It may not cover all possible information. If you have questions about this  medicine, talk to your doctor, pharmacist, or health care provider.  2020 Elsevier/Gold Standard (2015-11-03 12:00:25)  Preventing High Cholesterol Cholesterol is a white, waxy substance similar to fat that the human body needs to help build cells. The liver makes all the cholesterol that a person's body needs. Having high cholesterol (hypercholesterolemia) increases a person's risk for heart disease and stroke. Extra (excess) cholesterol comes from the food the person eats. High cholesterol can often be prevented with diet and lifestyle changes. If  you already have high cholesterol, you can control it with diet and lifestyle changes and with medicine. How can high cholesterol affect me? If you have high cholesterol, deposits (plaques) may build up on the walls of your arteries. The arteries are the blood vessels that carry blood away from your heart. Plaques make the arteries narrower and stiffer. This can limit or block blood flow and cause blood clots to form. Blood clots:  Are tiny balls of cells that form in your blood.  Can move to the heart or brain, causing a heart attack or stroke. Plaques in arteries greatly increase your risk for heart attack and stroke.Making diet and lifestyle changes can reduce your risk for these conditions that may threaten your life. What can increase my risk? This condition is more likely to develop in people who:  Eat foods that are high in saturated fat or cholesterol. Saturated fat is mostly found in: ? Foods that contain animal fat, such as red meat and some dairy products. ? Certain fatty foods made from plants, such as tropical oils.  Are overweight.  Are not getting enough exercise.  Have a family history of high cholesterol. What actions can I take to prevent this? Nutrition   Eat less saturated fat.  Avoid trans fats (partially hydrogenated oils). These are often found in margarine and in some baked goods, fried foods, and snacks bought in  packages.  Avoid precooked or cured meat, such as sausages or meat loaves.  Avoid foods and drinks that have added sugars.  Eat more fruits, vegetables, and whole grains.  Choose healthy sources of protein, such as fish, poultry, lean cuts of red meat, beans, peas, lentils, and nuts.  Choose healthy sources of fat, such as: ? Nuts. ? Vegetable oils, especially olive oil. ? Fish that have healthy fats (omega-3 fatty acids), such as mackerel or salmon. The items listed above may not be a complete list of recommended foods and beverages. Contact a dietitian for more information. Lifestyle  Lose weight if you are overweight. Losing 5-10 lb (2.3-4.5 kg) can help prevent or control high cholesterol. It can also lower your risk for diabetes and high blood pressure. Ask your health care provider to help you with a diet and exercise plan to lose weight safely.  Do not use any products that contain nicotine or tobacco, such as cigarettes, e-cigarettes, and chewing tobacco. If you need help quitting, ask your health care provider.  Limit your alcohol intake. ? Do not drink alcohol if:  Your health care provider tells you not to drink.  You are pregnant, may be pregnant, or are planning to become pregnant. ? If you drink alcohol:  Limit how much you use to:  0-1 drink a day for women.  0-2 drinks a day for men.  Be aware of how much alcohol is in your drink. In the [place] one drink equals one 12 oz bottle of beer (355 mL), one 5 oz glass of wine (148 mL), or one 1 oz glass of hard liquor (44 mL). Activity   Get enough exercise. Each week, do at least 150 minutes of exercise that takes a medium level of effort (moderate-intensity exercise). ? This is exercise that:  Makes your heart beat faster and makes you breathe harder than usual.  Allows you to still be able to talk. ? You could exercise in short sessions several times a day or longer sessions a few times a week. For example, on  5 days each  week, you could walk fast or ride your bike 3 times a day for 10 minutes each time.  Do exercises as told by your health care provider. Medicines  In addition to diet and lifestyle changes, your health care provider may recommend medicines to help lower cholesterol. This may be a medicine to lower the amount of cholesterol your liver makes. You may need medicine if: ? Diet and lifestyle changes do not lower your cholesterol enough. ? You have high cholesterol and other risk factors for heart disease or stroke.  Take over-the-counter and prescription medicines only as told by your health care provider. General information  Manage your risk factors for high cholesterol. Talk with your health care provider about all your risk factors and how to lower your risk.  Manage other conditions that you have, such as diabetes or high blood pressure (hypertension).  Have blood tests to check your cholesterol levels at regular points in time as told by your health care provider.  Keep all follow-up visits as told by your health care provider. This is important. Where to find more information  American Heart Association: www.heart.org  National Heart, Lung, and Blood Institute: https://wilson-eaton.com/ Summary  High cholesterol increases your risk for heart disease and stroke. By keeping your cholesterol level low, you can reduce your risk for these conditions.  High cholesterol can often be prevented with diet and lifestyle changes.  Work with your health care provider to manage your risk factors, and have your blood tested regularly. This information is not intended to replace advice given to you by your health care provider. Make sure you discuss any questions you have with your health care provider. Document Revised: 03/14/2019 Document Reviewed: 07/29/2016 Elsevier Patient Education  2020 [employer].

## 2020-07-05 NOTE — Progress Notes (Signed)
Established Patient Office Visit  Subjective:  Patient ID: Collin Garza, male    DOB: 24-Sep-1951  Age: 69 y.o. MRN: [id number]  CC:  Chief Complaint  Patient presents with  . Follow-up    6 month follow up on BP, no concerns.     HPI Collin Garza presents for follow-up of his elevated LDL cholesterol, hypertension, asthma vitamin D deficiency.  He decided not to start the atorvastatin due to side effects from rosuvastatin.  He had developed some upper back pain while taking that medication.  He admits that he is gaining some weight with the pandemic.  Now reports issues with achieving erection.  Continues to take the high-dose vitamin D and Zestril.  No issues with these medications.  Past Medical History:  Diagnosis Date  . Arthritis    LEFT HIP AND SHOULDER  . Depression   . Hypertension     Past Surgical History:  Procedure Laterality Date  . COLONOSCOPY    . INGUINAL HERNIA REPAIR     x3    Family History  Problem Relation Age of Onset  . Breast cancer Maternal Aunt   . Diabetes Maternal Uncle   . Heart disease Maternal Uncle   . Breast cancer Maternal Grandmother   . Pancreatic cancer Maternal Grandmother   . Cancer Maternal Grandmother   . Prostate cancer Cousin   . Stomach cancer Paternal Aunt   . Depression Mother   . Early death Mother   . Heart attack Mother   . Cancer Father   . COPD Father   . Hearing loss Father   . Arthritis Brother   . Alcohol abuse Daughter   . Depression Daughter   . Diabetes Daughter   . COPD Paternal Grandmother   . Hearing loss Brother   . Heart attack Brother   . Alcohol abuse Brother   . Asthma Brother   . COPD Brother   . Depression Brother   . Colon cancer Neg Hx   . Celiac disease Neg Hx   . Cirrhosis Neg Hx   . Clotting disorder Neg Hx   . Colitis Neg Hx   . Colon polyps Neg Hx   . Crohn's disease Neg Hx   . Cystic fibrosis Neg Hx   . Esophageal cancer Neg Hx   . Hemochromatosis Neg Hx   . Inflammatory  bowel disease Neg Hx   . Irritable bowel syndrome Neg Hx   . Kidney disease Neg Hx   . Liver cancer Neg Hx   . Liver disease Neg Hx   . Ovarian cancer Neg Hx   . Rectal cancer Neg Hx   . Ulcerative colitis Neg Hx   . Uterine cancer Neg Hx   . Wilson's disease Neg Hx     Social History   Socioeconomic History  . Marital status: Married    Spouse name: Not on file  . Number of children: Not on file  . Years of education: Not on file  . Highest education level: Not on file  Occupational History  . Not on file  Tobacco Use  . Smoking status: Former Research scientist (life sciences)  . Smokeless tobacco: Never Used  . Tobacco comment: quit when i was in the service  Substance and Sexual Activity  . Alcohol use: Yes    Alcohol/week: 7.0 - 9.0 standard drinks    Types: 7 - 9 Cans of beer per week  . Drug use: No  . Sexual activity: Not on file  Other Topics Concern  . Not on file  Social History Narrative  . Not on file   Social Determinants of Health   Financial Resource Strain:   . Difficulty of Paying Living Expenses:   Food Insecurity:   . Worried About Charity fundraiser in the Last Year:   . Arboriculturist in the Last Year:   Transportation Needs:   . Film/video editor (Medical):   [name] Lack of Transportation (Non-Medical):   Physical Activity:   . Days of Exercise per Week:   . Minutes of Exercise per Session:   Stress:   . Feeling of Stress :   Social Connections:   . Frequency of Communication with Friends and Family:   . Frequency of Social Gatherings with Friends and Family:   . Attends Religious Services:   . Active Member of Clubs or Organizations:   . Attends Archivist Meetings:   [name] Marital Status:   Intimate Partner Violence:   . Fear of Current or Ex-Partner:   . Emotionally Abused:   [name] Physically Abused:   . Sexually Abused:     Outpatient Medications Prior to Visit  Medication Sig Dispense Refill  . amLODipine (NORVASC) 5 MG tablet TAKE 1 TABLET(5  MG) BY MOUTH DAILY 90 tablet 1  . calcium-vitamin D (OSCAL 500/200 D-3) 500-200 MG-UNIT tablet Take 1 tablet by mouth 2 (two) times daily. 180 tablet 5  . co-enzyme Q-10 30 MG capsule Take 300 mg by mouth every other day.     . Vitamin D, Ergocalciferol, (DRISDOL) 1.25 MG (50000 UNIT) CAPS capsule Take 1 capsule (50,000 Units total) by mouth every 7 (seven) days. 30 capsule 0  . atorvastatin (LIPITOR) 20 MG tablet Take 1 tablet (20 mg total) by mouth daily. (Patient not taking: Reported on 07/05/2020) 90 tablet 1   No facility-administered medications prior to visit.    Allergies  Allergen Reactions  . Lisinopril Cough  . Penicillins   . Propoxyphene N-Acetaminophen     ROS Review of Systems  Constitutional: Negative.   HENT: Negative for hearing loss.   Eyes: Negative for photophobia and visual disturbance.  Respiratory: Negative.   Cardiovascular: Negative.   Gastrointestinal: Negative.   Genitourinary: Negative.   Musculoskeletal: Negative for joint swelling and myalgias.  Allergic/Immunologic: Negative for immunocompromised state.  Neurological: Negative for light-headedness and headaches.  Hematological: Does not bruise/bleed easily.  Psychiatric/Behavioral: Negative.       Objective:    Physical Exam Vitals and nursing note reviewed.  Constitutional:      General: He is not in acute distress.    Appearance: Normal appearance. He is not ill-appearing, toxic-appearing or diaphoretic.  HENT:     Head: Normocephalic and atraumatic.     Right Ear: There is impacted cerumen.     Left Ear: There is impacted cerumen.  Eyes:     General: No scleral icterus.       Right eye: No discharge.        Left eye: No discharge.     Extraocular Movements: Extraocular movements intact.     Conjunctiva/sclera: Conjunctivae normal.     Pupils: Pupils are equal, round, and reactive to light.  Cardiovascular:     Rate and Rhythm: Normal rate and regular rhythm.  Pulmonary:      Effort: Pulmonary effort is normal.     Breath sounds: Normal breath sounds.  Abdominal:     General: Bowel sounds are normal.  Musculoskeletal:  Cervical back: No rigidity or tenderness.  Lymphadenopathy:     Cervical: No cervical adenopathy.  Skin:    General: Skin is warm and dry.  Neurological:     Mental Status: He is alert and oriented to person, place, and time.  Psychiatric:        Mood and Affect: Mood normal.        Behavior: Behavior normal.     BP (!) 136/78   Pulse 60   Temp (!) 97 F (36.1 C) (Tympanic)   Ht 5\' 10"  (1.778 m)   Wt 196 lb 3.2 oz (89 kg)   SpO2 97%   BMI 28.15 kg/m  Wt Readings from Last 3 Encounters:  07/05/20 196 lb 3.2 oz (89 kg)  01/05/20 171 lb 9.6 oz (77.8 kg)  12/25/18 171 lb 4 oz (77.7 kg)     Health Maintenance Due  Topic Date Due  . INFLUENZA VACCINE  07/04/2020    There are no preventive care reminders to display for this patient.  Lab Results  Component Value Date   TSH 0.81 12/12/2017   Lab Results  Component Value Date   WBC 7.6 01/05/2020   HGB 15.5 01/05/2020   HCT 44.9 01/05/2020   MCV 95.1 01/05/2020   PLT 222.0 01/05/2020   Lab Results  Component Value Date   NA 138 01/05/2020   K 3.9 01/05/2020   CO2 26 01/05/2020   GLUCOSE 107 (H) 01/05/2020   BUN 14 01/05/2020   CREATININE 0.78 01/05/2020   BILITOT 0.6 01/05/2020   ALKPHOS 53 01/05/2020   AST 15 01/05/2020   ALT 13 01/05/2020   PROT 6.7 01/05/2020   ALBUMIN 4.5 01/05/2020   CALCIUM 9.6 01/05/2020   GFR 99.48 01/05/2020   Lab Results  Component Value Date   CHOL 205 (H) 01/05/2020   Lab Results  Component Value Date   HDL 67.00 01/05/2020   Lab Results  Component Value Date   LDLCALC 121 (H) 01/05/2020   Lab Results  Component Value Date   TRIG 87.0 01/05/2020   Lab Results  Component Value Date   CHOLHDL 3 01/05/2020   Lab Results  Component Value Date   HGBA1C 5.3 06/17/2018      Assessment & Plan:   Problem List  Items Addressed This Visit      Cardiovascular and Mediastinum   Essential hypertension - Primary   Relevant Medications   sildenafil (REVATIO) 20 MG tablet   atorvastatin (LIPITOR) 20 MG tablet   Other Relevant Orders   CBC   Comprehensive metabolic panel   Erectile dysfunction due to arterial insufficiency   Relevant Medications   sildenafil (REVATIO) 20 MG tablet   atorvastatin (LIPITOR) 20 MG tablet     Nervous and Auditory   Impacted cerumen of left ear   Relevant Medications   carbamide peroxide (DEBROX) 6.5 % OTIC solution     Other   HYPERLIPIDEMIA   Relevant Medications   sildenafil (REVATIO) 20 MG tablet   atorvastatin (LIPITOR) 20 MG tablet   Other Relevant Orders   Comprehensive metabolic panel   LDL cholesterol, direct   Lipid panel   Vitamin D deficiency   Relevant Orders   VITAMIN D 25 Hydroxy (Vit-D Deficiency, Fractures)      Meds ordered this encounter  Medications  . sildenafil (REVATIO) 20 MG tablet    Sig: May take 3-5 daily 45 minutes prior to intercourse as needed.    Dispense:  50 tablet  Refill:  1  . carbamide peroxide (DEBROX) 6.5 % OTIC solution    Sig: Place 5 drops into both ears 2 (two) times daily.    Dispense:  15 mL    Refill:  0  . atorvastatin (LIPITOR) 20 MG tablet    Sig: Take 1 tablet (20 mg total) by mouth daily.    Dispense:  90 tablet    Refill:  1    Follow-up: Return in about 3 months (around 10/05/2020).  Discussed that his ED could be related to vascular disease.  He agrees to give Lipitor a try.  Will report any unusual muscle aches with that medication.  Would like to try Revatio.  We discussed using that medication.  Encouraged him to go ahead and use the Debrox for his ears.  Libby Maw, MD

## 2020-07-06 NOTE — Addendum Note (Signed)
Addended by: Jon Billings on: 07/06/2020 07:37 AM   Modules accepted: Orders

## 2020-07-13 ENCOUNTER — Telehealth: Payer: Self-pay | Admitting: Family Medicine

## 2020-07-13 NOTE — Telephone Encounter (Signed)
Collin Garza with [employer] called and stated the sildenafil prescription requires preauthorization. Collin Garza faxed the preauth forms and I put them in Dr. Bebe Shaggy folder in the front office to be filled out. Also, preauthorization phone number is [phone].

## 2020-07-23 NOTE — Telephone Encounter (Signed)
Collin Garza with [employer] called and stated they do not have the prior authorization forms on file. She will fax them to [place] again.

## 2020-07-23 NOTE — Telephone Encounter (Signed)
Spoke with patient who verbally understood PA would be denied do to medication not being a medical necessity. Patient agrees to use Good Rx card and pay 25-30 dollars for Rx.

## 2020-07-23 NOTE — Telephone Encounter (Signed)
Received faxed prior authorization forms and gave to [facility].

## 2020-08-08 ENCOUNTER — Other Ambulatory Visit: Payer: Self-pay | Admitting: Family Medicine

## 2020-08-08 DIAGNOSIS — E559 Vitamin D deficiency, unspecified: Secondary | ICD-10-CM

## 2020-08-12 ENCOUNTER — Other Ambulatory Visit: Payer: Self-pay | Admitting: Family Medicine

## 2020-08-12 DIAGNOSIS — N5201 Erectile dysfunction due to arterial insufficiency: Secondary | ICD-10-CM

## 2020-09-18 ENCOUNTER — Ambulatory Visit: Payer: BC Managed Care – PPO | Attending: Internal Medicine

## 2020-09-18 DIAGNOSIS — Z23 Encounter for immunization: Secondary | ICD-10-CM

## 2020-09-18 NOTE — Progress Notes (Signed)
   Covid-19 Vaccination Clinic  Name:  JAMES SENN    MRN: [id number] DOB: 03-14-1951  09/18/2020  Mr. Longsworth was observed post Covid-19 immunization for 15 minutes without incident. He was provided with Vaccine Information Sheet and instruction to access the V-Safe system.   Mr. Dowis was instructed to call 911 with any severe reactions post vaccine: [name] Difficulty breathing  . Swelling of face and throat  . A fast heartbeat  . A bad rash all over body  . Dizziness and weakness

## 2020-09-29 ENCOUNTER — Ambulatory Visit: Payer: BC Managed Care – PPO | Admitting: Family Medicine

## 2020-10-05 ENCOUNTER — Ambulatory Visit: Payer: BC Managed Care – PPO | Admitting: Family Medicine

## 2020-10-05 ENCOUNTER — Telehealth (INDEPENDENT_AMBULATORY_CARE_PROVIDER_SITE_OTHER): Payer: BC Managed Care – PPO | Admitting: Family Medicine

## 2020-10-05 ENCOUNTER — Encounter: Payer: Self-pay | Admitting: Family Medicine

## 2020-10-05 VITALS — Temp 97.9°F | Ht 70.0 in | Wt 189.0 lb

## 2020-10-05 DIAGNOSIS — E782 Mixed hyperlipidemia: Secondary | ICD-10-CM

## 2020-10-05 DIAGNOSIS — I10 Essential (primary) hypertension: Secondary | ICD-10-CM

## 2020-10-05 MED ORDER — ATORVASTATIN CALCIUM 20 MG PO TABS: 20.0000 mg | ORAL_TABLET | Freq: Every day | ORAL | 1 refills | Status: DC

## 2020-10-05 MED ORDER — AMLODIPINE BESYLATE 5 MG PO TABS: ORAL_TABLET | ORAL | 1 refills | Status: DC

## 2020-10-05 NOTE — Progress Notes (Signed)
Established Patient Office Visit  Subjective:  Patient ID: Collin Garza, male    DOB: 1951/03/07  Age: 69 y.o. MRN: [id number]  CC:  Chief Complaint  Patient presents with  . Follow-up    3 month follow up, no concerns.     HPI Collin Garza presents for follow-up of hypertension and elevated cholesterol.  Blood pressure well controlled with the Norvasc.  Blood pressures have typically been running running in the 120/80 range.  He is tolerating daily atorvastatin.  He does have some muscle aches and pains but cannot particularly attribute them to the medication.  He has had his booster for Covid and will have his flu vaccine this week.  Past Medical History:  Diagnosis Date  . Arthritis    LEFT HIP AND SHOULDER  . Depression   . Hypertension     Past Surgical History:  Procedure Laterality Date  . COLONOSCOPY    . INGUINAL HERNIA REPAIR     x3    Family History  Problem Relation Age of Onset  . Breast cancer Maternal Aunt   . Diabetes Maternal Uncle   . Heart disease Maternal Uncle   . Breast cancer Maternal Grandmother   . Pancreatic cancer Maternal Grandmother   . Cancer Maternal Grandmother   . Prostate cancer Cousin   . Stomach cancer Paternal Aunt   . Depression Mother   . Early death Mother   . Heart attack Mother   . Cancer Father   . COPD Father   . Hearing loss Father   . Arthritis Brother   . Alcohol abuse Daughter   . Depression Daughter   . Diabetes Daughter   . COPD Paternal Grandmother   . Hearing loss Brother   . Heart attack Brother   . Alcohol abuse Brother   . Asthma Brother   . COPD Brother   . Depression Brother   . Colon cancer Neg Hx   . Celiac disease Neg Hx   . Cirrhosis Neg Hx   . Clotting disorder Neg Hx   . Colitis Neg Hx   . Colon polyps Neg Hx   . Crohn's disease Neg Hx   . Cystic fibrosis Neg Hx   . Esophageal cancer Neg Hx   . Hemochromatosis Neg Hx   . Inflammatory bowel disease Neg Hx   . Irritable bowel syndrome  Neg Hx   . Kidney disease Neg Hx   . Liver cancer Neg Hx   . Liver disease Neg Hx   . Ovarian cancer Neg Hx   . Rectal cancer Neg Hx   . Ulcerative colitis Neg Hx   . Uterine cancer Neg Hx   . Wilson's disease Neg Hx     Social History   Socioeconomic History  . Marital status: Married    Spouse name: Not on file  . Number of children: Not on file  . Years of education: Not on file  . Highest education level: Not on file  Occupational History  . Not on file  Tobacco Use  . Smoking status: Former Research scientist (life sciences)  . Smokeless tobacco: Never Used  . Tobacco comment: quit when i was in the service  Substance and Sexual Activity  . Alcohol use: Yes    Alcohol/week: 7.0 - 9.0 standard drinks    Types: 7 - 9 Cans of beer per week  . Drug use: No  . Sexual activity: Not on file  Other Topics Concern  . Not on file  Social History Narrative  . Not on file   Social Determinants of Health   Financial Resource Strain:   . Difficulty of Paying Living Expenses: Not on file  Food Insecurity:   . Worried About Charity fundraiser in the Last Year: Not on file  . Ran Out of Food in the Last Year: Not on file  Transportation Needs:   . Lack of Transportation (Medical): Not on file  . Lack of Transportation (Non-Medical): Not on file  Physical Activity:   . Days of Exercise per Week: Not on file  . Minutes of Exercise per Session: Not on file  Stress:   . Feeling of Stress : Not on file  Social Connections:   . Frequency of Communication with Friends and Family: Not on file  . Frequency of Social Gatherings with Friends and Family: Not on file  . Attends Religious Services: Not on file  . Active Member of Clubs or Organizations: Not on file  . Attends Archivist Meetings: Not on file  . Marital Status: Not on file  Intimate Partner Violence:   . Fear of Current or Ex-Partner: Not on file  . Emotionally Abused: Not on file  . Physically Abused: Not on file  . Sexually  Abused: Not on file    Outpatient Medications Prior to Visit  Medication Sig Dispense Refill  . calcium-vitamin D (OSCAL 500/200 D-3) 500-200 MG-UNIT tablet Take 1 tablet by mouth 2 (two) times daily. 180 tablet 5  . carbamide peroxide (DEBROX) 6.5 % OTIC solution Place 5 drops into both ears 2 (two) times daily. 15 mL 0  . co-enzyme Q-10 30 MG capsule Take 300 mg by mouth every other day.     . sildenafil (REVATIO) 20 MG tablet TAKE 3-5 TABLETS BY MOUTH DAILY 45 MINUTES PRIOR TO INTERCOURSE AS NEEDED 50 tablet 1  . amLODipine (NORVASC) 5 MG tablet TAKE 1 TABLET(5 MG) BY MOUTH DAILY 90 tablet 1  . atorvastatin (LIPITOR) 20 MG tablet Take 1 tablet (20 mg total) by mouth daily. 90 tablet 1   No facility-administered medications prior to visit.    Allergies  Allergen Reactions  . Lisinopril Cough  . Penicillins   . Propoxyphene N-Acetaminophen     ROS Review of Systems  Constitutional: Negative.   HENT: Negative.   Eyes: Negative for photophobia and visual disturbance.  Respiratory: Negative.   Cardiovascular: Negative.   Endocrine: Negative for polyphagia and polyuria.  Musculoskeletal: Negative.   Allergic/Immunologic: Negative for immunocompromised state.  Neurological: Negative for facial asymmetry and speech difficulty.  Hematological: Does not bruise/bleed easily.  Psychiatric/Behavioral: Negative.       Objective:    Physical Exam Vitals and nursing note reviewed.  Constitutional:      General: He is not in acute distress.    Appearance: Normal appearance. He is not ill-appearing, toxic-appearing or diaphoretic.  HENT:     Head: Normocephalic and atraumatic.     Right Ear: External ear normal.     Left Ear: External ear normal.  Eyes:     General: No scleral icterus.       Right eye: No discharge.        Left eye: No discharge.     Conjunctiva/sclera: Conjunctivae normal.  Pulmonary:     Effort: Pulmonary effort is normal.  Neurological:     Mental Status:  He is alert and oriented to person, place, and time.  Psychiatric:        Mood  and Affect: Mood normal.        Behavior: Behavior normal.     Temp 97.9 F (36.6 C) (Tympanic)   Ht 5\' 10"  (1.778 m)   Wt 189 lb (85.7 kg)   BMI 27.12 kg/m  Wt Readings from Last 3 Encounters:  10/05/20 189 lb (85.7 kg)  07/05/20 196 lb 3.2 oz (89 kg)  01/05/20 171 lb 9.6 oz (77.8 kg)   The 10-year ASCVD risk score Mikey Bussing DC Jr., et al., 2013) is: 17.2%   Values used to calculate the score:     Age: 42 years     Sex: Male     Is Non-Hispanic African American: No     Diabetic: No     Tobacco smoker: No     Systolic Blood Pressure: 094 mmHg     Is BP treated: Yes     HDL Cholesterol: 69.8 mg/dL     Total Cholesterol: 228 mg/dL  Health Maintenance Due  Topic Date Due  . INFLUENZA VACCINE  07/04/2020    There are no preventive care reminders to display for this patient.  Lab Results  Component Value Date   TSH 0.81 12/12/2017   Lab Results  Component Value Date   WBC 7.9 07/05/2020   HGB 15.1 07/05/2020   HCT 43.8 07/05/2020   MCV 93.7 07/05/2020   PLT 222.0 07/05/2020   Lab Results  Component Value Date   NA 135 07/05/2020   K 4.1 07/05/2020   CO2 26 07/05/2020   GLUCOSE 104 (H) 07/05/2020   BUN 18 07/05/2020   CREATININE 0.84 07/05/2020   BILITOT 0.8 07/05/2020   ALKPHOS 51 07/05/2020   AST 20 07/05/2020   ALT 18 07/05/2020   PROT 6.8 07/05/2020   ALBUMIN 4.4 07/05/2020   CALCIUM 9.6 07/05/2020   GFR 91.19 07/05/2020   Lab Results  Component Value Date   CHOL 228 (H) 07/05/2020   Lab Results  Component Value Date   HDL 69.80 07/05/2020   Lab Results  Component Value Date   LDLCALC 139 (H) 07/05/2020   Lab Results  Component Value Date   TRIG 98.0 07/05/2020   Lab Results  Component Value Date   CHOLHDL 3 07/05/2020   Lab Results  Component Value Date   HGBA1C 5.3 06/17/2018      Assessment & Plan:   Problem List Items Addressed This Visit       Cardiovascular and Mediastinum   Essential hypertension - Primary   Relevant Medications   atorvastatin (LIPITOR) 20 MG tablet   amLODipine (NORVASC) 5 MG tablet   Other Relevant Orders   Comprehensive metabolic panel     Other   HYPERLIPIDEMIA   Relevant Medications   atorvastatin (LIPITOR) 20 MG tablet   amLODipine (NORVASC) 5 MG tablet   Other Relevant Orders   Comprehensive metabolic panel   Lipid panel      Meds ordered this encounter  Medications  . atorvastatin (LIPITOR) 20 MG tablet    Sig: Take 1 tablet (20 mg total) by mouth daily.    Dispense:  90 tablet    Refill:  1  . amLODipine (NORVASC) 5 MG tablet    Sig: Take one daily    Dispense:  90 tablet    Refill:  1    Follow-up: Return in about 6 months (around 04/04/2021), or Yearly physical and recheck of psa..  Follow-up in 6 months for yearly physical.  Continue Norvasc daily.  Continue atorvastatin.  Recheck  PSA.  Libby Maw, MD   Virtual Visit via Video Note  I connected with Collin Garza on 10/05/20 at  1:30 PM EDT by a video enabled telemedicine application and verified that I am speaking with the correct person using two identifiers.  Location: Patient: at home alone.  Provider: home   I discussed the limitations of evaluation and management by telemedicine and the availability of in person appointments. The patient expressed understanding and agreed to proceed.  History of Present Illness:    Observations/Objective:   Assessment and Plan:   Follow Up Instructions:    I discussed the assessment and treatment plan with the patient. The patient was provided an opportunity to ask questions and all were answered. The patient agreed with the plan and demonstrated an understanding of the instructions.   The patient was advised to call back or seek an in-person evaluation if the symptoms worsen or if the condition fails to improve as anticipated.  I provided 25 minutes of  non-face-to-face time during this encounter.   Libby Maw, MD

## 2020-11-17 ENCOUNTER — Other Ambulatory Visit: Payer: Self-pay | Admitting: Family Medicine

## 2020-11-17 DIAGNOSIS — I10 Essential (primary) hypertension: Secondary | ICD-10-CM

## 2020-11-17 NOTE — Telephone Encounter (Signed)
Last VV 10/05/20 Dispense Quantity: 90 tablet Refills: 1        Sig: Take one daily       Start Date: 10/05/20    Pt should have a refill left.

## 2020-12-31 ENCOUNTER — Other Ambulatory Visit: Payer: Self-pay | Admitting: Family Medicine

## 2020-12-31 DIAGNOSIS — E782 Mixed hyperlipidemia: Secondary | ICD-10-CM

## 2021-04-18 ENCOUNTER — Telehealth: Payer: Self-pay | Admitting: Family Medicine

## 2021-04-18 NOTE — Telephone Encounter (Signed)
Patient states his provider wants him to have his labs done before his appointment on 05/17/2021 @ 10:00 so that they can be discussed at the visit. If approved to do, please place orders and let front office know so that we can call and schedule lab appointment.  Patients contact info: [phone]

## 2021-04-19 NOTE — Telephone Encounter (Signed)
Please advise message below patient states that he was asked to come in prior to his visit to have labs so they could discussed at next visit?Okay to order labs before OV?

## 2021-04-19 NOTE — Telephone Encounter (Signed)
Patient aware of message below.

## 2021-04-21 DIAGNOSIS — H40013 Open angle with borderline findings, low risk, bilateral: Secondary | ICD-10-CM | POA: Diagnosis not present

## 2021-04-25 ENCOUNTER — Ambulatory Visit: Payer: BC Managed Care – PPO

## 2021-04-26 ENCOUNTER — Ambulatory Visit: Payer: BC Managed Care – PPO

## 2021-05-03 ENCOUNTER — Ambulatory Visit: Payer: BC Managed Care – PPO

## 2021-05-03 NOTE — Progress Notes (Signed)
   Covid-19 Vaccination Clinic  Name:  Collin Garza    MRN: [id number] DOB: [birth year]  05/03/2021  Mr. [name] was observed post Covid-19 immunization for 15 minutes without incident. He was provided with Vaccine Information Sheet and instruction to access the V-Safe system.   Mr. [name] was instructed to call 911 with any severe reactions post vaccine: [name] Difficulty breathing  . Swelling of face and throat  . A fast heartbeat  . A bad rash all over body  . Dizziness and weakness

## 2021-05-09 ENCOUNTER — Other Ambulatory Visit (HOSPITAL_BASED_OUTPATIENT_CLINIC_OR_DEPARTMENT_OTHER): Payer: Self-pay

## 2021-05-09 MED ORDER — PFIZER-BIONT COVID-19 VAC-TRIS 30 MCG/0.3ML IM SUSP
INTRAMUSCULAR | 0 refills | Status: DC
  Filled 2021-05-09: qty 0.3, 1d supply, fill #0

## 2021-05-10 ENCOUNTER — Other Ambulatory Visit (HOSPITAL_BASED_OUTPATIENT_CLINIC_OR_DEPARTMENT_OTHER): Payer: Self-pay

## 2021-05-13 ENCOUNTER — Other Ambulatory Visit: Payer: Self-pay | Admitting: Family Medicine

## 2021-05-13 DIAGNOSIS — I10 Essential (primary) hypertension: Secondary | ICD-10-CM

## 2021-05-17 ENCOUNTER — Ambulatory Visit (INDEPENDENT_AMBULATORY_CARE_PROVIDER_SITE_OTHER): Payer: BC Managed Care – PPO | Admitting: Family Medicine

## 2021-05-17 ENCOUNTER — Other Ambulatory Visit: Payer: Self-pay

## 2021-05-17 ENCOUNTER — Encounter: Payer: Self-pay | Admitting: Family Medicine

## 2021-05-17 VITALS — BP 132/74 | HR 65 | Temp 97.8°F | Ht 70.0 in | Wt 188.2 lb

## 2021-05-17 DIAGNOSIS — E782 Mixed hyperlipidemia: Secondary | ICD-10-CM

## 2021-05-17 DIAGNOSIS — I10 Essential (primary) hypertension: Secondary | ICD-10-CM

## 2021-05-17 DIAGNOSIS — H6122 Impacted cerumen, left ear: Secondary | ICD-10-CM

## 2021-05-17 DIAGNOSIS — R972 Elevated prostate specific antigen [PSA]: Secondary | ICD-10-CM | POA: Diagnosis not present

## 2021-05-17 LAB — COMPREHENSIVE METABOLIC PANEL
ALT: 18 U/L (ref 0–53)
AST: 18 U/L (ref 0–37)
Albumin: 4.4 g/dL (ref 3.5–5.2)
Alkaline Phosphatase: 61 U/L (ref 39–117)
BUN: 12 mg/dL (ref 6–23)
CO2: 27 mEq/L (ref 19–32)
Calcium: 9.4 mg/dL (ref 8.4–10.5)
Chloride: 103 mEq/L (ref 96–112)
Creatinine, Ser: 0.79 mg/dL (ref 0.40–1.50)
GFR: 91.8 mL/min (ref >60.00)
Glucose, Bld: 102 mg/dL — ABNORMAL HIGH (ref 70–99)
Potassium: 4.2 mEq/L (ref 3.5–5.1)
Sodium: 139 mEq/L (ref 135–145)
Total Bilirubin: 0.7 mg/dL (ref 0.2–1.2)
Total Protein: 6.6 g/dL (ref 6.0–8.3)

## 2021-05-17 LAB — URINALYSIS, ROUTINE W REFLEX MICROSCOPIC
Bilirubin Urine: NEGATIVE
Hgb urine dipstick: NEGATIVE
Ketones, ur: NEGATIVE
Leukocytes,Ua: NEGATIVE
Nitrite: NEGATIVE
RBC / HPF: NONE SEEN (ref 0–?)
Specific Gravity, Urine: <=1.005 — AB (ref 1.000–1.030)
Total Protein, Urine: NEGATIVE
Urine Glucose: NEGATIVE
Urobilinogen, UA: 0.2 (ref 0.0–1.0)
WBC, UA: NONE SEEN (ref 0–?)
pH: 7 (ref 5.0–8.0)

## 2021-05-17 LAB — LIPID PANEL
Cholesterol: 159 mg/dL (ref 0–200)
HDL: 71.7 mg/dL (ref >39.00)
LDL Cholesterol: 69 mg/dL (ref 0–99)
NonHDL: 87.4
Total CHOL/HDL Ratio: 2
Triglycerides: 91 mg/dL (ref 0.0–149.0)
VLDL: 18.2 mg/dL (ref 0.0–40.0)

## 2021-05-17 LAB — CBC
HCT: 42.2 % (ref 39.0–52.0)
Hemoglobin: 14.5 g/dL (ref 13.0–17.0)
MCHC: 34.5 g/dL (ref 30.0–36.0)
MCV: 92.3 fl (ref 78.0–100.0)
Platelets: 263 10*3/uL (ref 150.0–400.0)
RBC: 4.58 Mil/uL (ref 4.22–5.81)
RDW: 13.5 % (ref 11.5–15.5)
WBC: 7.4 10*3/uL (ref 4.0–10.5)

## 2021-05-17 LAB — PSA: PSA: 6.28 ng/mL — ABNORMAL HIGH (ref 0.10–4.00)

## 2021-05-17 NOTE — Progress Notes (Addendum)
Established Patient Office Visit  Subjective:  Patient ID: Collin Garza, male    DOB: 18-Apr-1951  Age: 70 y.o. MRN: [id number]  CC:  Chief Complaint  Patient presents with   Annual Exam    CPE, no concerns. Patient fasting for labs.     HPI Collin Garza presents for follow-up of hypertension, elevated cholesterol, elevated PSA and ceruminosis.  Has been using the Debrox regularly.  He is still congested.  Having no issues with amlodipine or Lipitor for elevated cholesterol.  Exercising regularly.  Using Revatio as needed for ED with good effect.  Past Medical History:  Diagnosis Date   Arthritis    LEFT HIP AND SHOULDER   Depression    Hypertension     Past Surgical History:  Procedure Laterality Date   COLONOSCOPY     INGUINAL HERNIA REPAIR     x3    Family History  Problem Relation Age of Onset   Breast cancer Maternal Aunt    Diabetes Maternal Uncle    Heart disease Maternal Uncle    Breast cancer Maternal Grandmother    Pancreatic cancer Maternal Grandmother    Cancer Maternal Grandmother    Prostate cancer Cousin    Stomach cancer Paternal Aunt    Depression Mother    Early death Mother    Heart attack Mother    Cancer Father    COPD Father    Hearing loss Father    Arthritis Brother    Alcohol abuse Daughter    Depression Daughter    Diabetes Daughter    COPD Paternal Grandmother    Hearing loss Brother    Heart attack Brother    Alcohol abuse Brother    Asthma Brother    COPD Brother    Depression Brother    Colon cancer Neg Hx    Celiac disease Neg Hx    Cirrhosis Neg Hx    Clotting disorder Neg Hx    Colitis Neg Hx    Colon polyps Neg Hx    Crohn's disease Neg Hx    Cystic fibrosis Neg Hx    Esophageal cancer Neg Hx    Hemochromatosis Neg Hx    Inflammatory bowel disease Neg Hx    Irritable bowel syndrome Neg Hx    Kidney disease Neg Hx    Liver cancer Neg Hx    Liver disease Neg Hx    Ovarian cancer Neg Hx    Rectal cancer Neg Hx     Ulcerative colitis Neg Hx    Uterine cancer Neg Hx    Wilson's disease Neg Hx     Social History   Socioeconomic History   Marital status: Married    Spouse name: Not on file   Number of children: Not on file   Years of education: Not on file   Highest education level: Not on file  Occupational History   Not on file  Tobacco Use   Smoking status: Former    Pack years: 0.00   Smokeless tobacco: Never   Tobacco comments:    quit when i was in the service  Vaping Use   Vaping Use: Never used  Substance and Sexual Activity   Alcohol use: Yes    Alcohol/week: 7.0 - 9.0 standard drinks    Types: 7 - 9 Cans of beer per week   Drug use: No   Sexual activity: Yes  Other Topics Concern   Not on file  Social History Narrative  Not on file   Social Determinants of Health   Financial Resource Strain: Not on file  Food Insecurity: Not on file  Transportation Needs: Not on file  Physical Activity: Not on file  Stress: Not on file  Social Connections: Not on file  Intimate Partner Violence: Not on file    Outpatient Medications Prior to Visit  Medication Sig Dispense Refill   amLODipine (NORVASC) 5 MG tablet TAKE 1 TABLET BY MOUTH DAILY 90 tablet 0   atorvastatin (LIPITOR) 20 MG tablet TAKE 1 TABLET(20 MG) BY MOUTH DAILY 90 tablet 1   calcium-vitamin D (OSCAL 500/200 D-3) 500-200 MG-UNIT tablet Take 1 tablet by mouth 2 (two) times daily. 180 tablet 5   carbamide peroxide (DEBROX) 6.5 % OTIC solution Place 5 drops into both ears 2 (two) times daily. 15 mL 0   co-enzyme Q-10 30 MG capsule Take 300 mg by mouth every other day.      sildenafil (REVATIO) 20 MG tablet TAKE 3-5 TABLETS BY MOUTH DAILY 45 MINUTES PRIOR TO INTERCOURSE AS NEEDED 50 tablet 1   COVID-19 mRNA Vac-TriS, Pfizer, (PFIZER-BIONT COVID-19 VAC-TRIS) SUSP injection Inject into the muscle. 0.3 mL 0   No facility-administered medications prior to visit.    Allergies  Allergen Reactions   Lisinopril Cough    Penicillins    Propoxyphene N-Acetaminophen     ROS Review of Systems  Constitutional: Negative.   HENT: Negative.    Eyes:  Negative for photophobia and visual disturbance.  Respiratory: Negative.    Cardiovascular: Negative.   Gastrointestinal: Negative.   Genitourinary:  Positive for difficulty urinating. Negative for frequency and urgency.  Musculoskeletal:  Negative for gait problem and joint swelling.  Skin: Negative.   Neurological:  Negative for speech difficulty and weakness.  Hematological: Negative.   Psychiatric/Behavioral: Negative.    Depression screen [facility] 2/9 05/17/2021 05/17/2021 10/05/2020  Decreased Interest 0 0 0  Down, Depressed, Hopeless 1 0 0  PHQ - 2 Score 1 0 0  Altered sleeping 1 - -  Tired, decreased energy 0 - -  Change in appetite 0 - -  Feeling bad or failure about yourself  0 - -  Trouble concentrating 0 - -  Moving slowly or fidgety/restless 0 - -  Suicidal thoughts 0 - -  PHQ-9 Score 2 - -  Difficult doing work/chores Not difficult at all - -       Objective:    Physical Exam Vitals and nursing note reviewed.  Constitutional:      General: He is not in acute distress.    Appearance: Normal appearance. He is normal weight. He is not toxic-appearing.  HENT:     Head: Normocephalic and atraumatic.     Right Ear: Tympanic membrane, ear canal and external ear normal.     Left Ear: There is impacted cerumen.     Mouth/Throat:     Mouth: Mucous membranes are moist.     Pharynx: Oropharynx is clear. No oropharyngeal exudate or posterior oropharyngeal erythema.  Eyes:     General: No scleral icterus.       Right eye: No discharge.        Left eye: No discharge.     Extraocular Movements: Extraocular movements intact.     Conjunctiva/sclera: Conjunctivae normal.     Pupils: Pupils are equal, round, and reactive to light.  Neck:     Vascular: No carotid bruit.  Cardiovascular:     Rate and Rhythm: Normal rate and regular  rhythm.   Pulmonary:     Effort: Pulmonary effort is normal.     Breath sounds: Normal breath sounds.  Abdominal:     General: Abdomen is flat. Bowel sounds are normal. There is no distension.     Palpations: There is no mass.     Tenderness: There is no abdominal tenderness. There is no guarding or rebound.     Hernia: A hernia is present. Hernia is present in the ventral area.  Genitourinary:    Prostate: Enlarged. Not tender and no nodules present.     Rectum: Guaiac result negative. No mass, tenderness, anal fissure, external hemorrhoid or internal hemorrhoid. Normal anal tone.  Musculoskeletal:     Cervical back: Normal range of motion and neck supple. No rigidity or tenderness.  Lymphadenopathy:     Cervical: No cervical adenopathy.  Skin:    General: Skin is warm and dry.  Neurological:     Mental Status: He is alert and oriented to person, place, and time.  Psychiatric:        Mood and Affect: Mood normal.        Behavior: Behavior normal.    BP 132/74   Pulse 65   Temp 97.8 F (36.6 C) (Temporal)   Ht 5\' 10"  (1.778 m)   Wt 188 lb 3.2 oz (85.4 kg)   SpO2 98%   BMI 27.00 kg/m  Wt Readings from Last 3 Encounters:  05/17/21 188 lb 3.2 oz (85.4 kg)  10/05/20 189 lb (85.7 kg)  07/05/20 196 lb 3.2 oz (89 kg)     Health Maintenance Due  Topic Date Due   Zoster Vaccines- Shingrix (1 of 2) Never done    There are no preventive care reminders to display for this patient.  Lab Results  Component Value Date   TSH 0.81 12/12/2017   Lab Results  Component Value Date   WBC 7.4 05/17/2021   HGB 14.5 05/17/2021   HCT 42.2 05/17/2021   MCV 92.3 05/17/2021   PLT 263.0 05/17/2021   Lab Results  Component Value Date   NA 139 05/17/2021   K 4.2 05/17/2021   CO2 27 05/17/2021   GLUCOSE 102 (H) 05/17/2021   BUN 12 05/17/2021   CREATININE 0.79 05/17/2021   BILITOT 0.7 05/17/2021   ALKPHOS 61 05/17/2021   AST 18 05/17/2021   ALT 18 05/17/2021   PROT 6.6 05/17/2021    ALBUMIN 4.4 05/17/2021   CALCIUM 9.4 05/17/2021   GFR 91.80 05/17/2021   Lab Results  Component Value Date   CHOL 159 05/17/2021   Lab Results  Component Value Date   HDL 71.70 05/17/2021   Lab Results  Component Value Date   LDLCALC 69 05/17/2021   Lab Results  Component Value Date   TRIG 91.0 05/17/2021   Lab Results  Component Value Date   CHOLHDL 2 05/17/2021   Lab Results  Component Value Date   HGBA1C 5.3 06/17/2018      Assessment & Plan:   Problem List Items Addressed This Visit       Cardiovascular and Mediastinum   Essential hypertension - Primary   Relevant Orders   CBC (Completed)   Comprehensive metabolic panel (Completed)   Urinalysis, Routine w reflex microscopic (Completed)     Nervous and Auditory   Impacted cerumen of left ear     Other   HYPERLIPIDEMIA   Relevant Orders   Comprehensive metabolic panel (Completed)   Lipid panel (Completed)   Elevated PSA  Relevant Orders   PSA (Completed)   Ambulatory referral to Urology    No orders of the defined types were placed in this encounter.   Follow-up: Return in about 6 months (around 11/16/2021).  Patient will make appointment for ear irrigation.  Continue Debrox eardrops.  Continue Lipitor for elevated cholesterol and amlodipine for blood pressure.  Encouraged to exercise on a daily basis.  Rechecking PSA today.  Libby Maw, MD

## 2021-05-22 NOTE — Addendum Note (Signed)
Addended by: Jon Billings on: 05/22/2021 06:51 AM   Modules accepted: Orders

## 2021-05-24 ENCOUNTER — Encounter: Payer: Self-pay | Admitting: Family Medicine

## 2021-05-24 ENCOUNTER — Other Ambulatory Visit: Payer: Self-pay

## 2021-05-24 ENCOUNTER — Ambulatory Visit: Payer: BC Managed Care – PPO | Admitting: Family Medicine

## 2021-05-24 VITALS — BP 142/78 | HR 81 | Temp 96.9°F | Ht 70.0 in | Wt 187.0 lb

## 2021-05-24 DIAGNOSIS — H6122 Impacted cerumen, left ear: Secondary | ICD-10-CM | POA: Diagnosis not present

## 2021-05-24 DIAGNOSIS — R972 Elevated prostate specific antigen [PSA]: Secondary | ICD-10-CM

## 2021-05-24 DIAGNOSIS — E782 Mixed hyperlipidemia: Secondary | ICD-10-CM

## 2021-05-24 NOTE — Progress Notes (Signed)
Established Patient Office Visit  Subjective:  Patient ID: Collin Garza, male    DOB: 10/14/1951  Age: 70 y.o. MRN: [id number]  CC:  Chief Complaint  Patient presents with   Cerumen Impaction    Ears clogged x 2 month drops not helping.     HPI Collin Garza presents for follow-up of ceruminosis, elevated LDL cholesterol with favorable HDL cholesterol, and PSA velocity.  PSA has increased over the last 3 years.  He has seen urology in the past.  They advised follow-up if needed.  Patient has been using Debrox eardrops as directed.  Ceruminosis has not resolved.  His hearing is been affected there is mild discomfort.  Patient is taking a statin.  He is having some mild muscle pain on either side of his posterior chest.  Past Medical History:  Diagnosis Date   Arthritis    LEFT HIP AND SHOULDER   Depression    Hypertension     Past Surgical History:  Procedure Laterality Date   COLONOSCOPY     INGUINAL HERNIA REPAIR     x3    Family History  Problem Relation Age of Onset   Breast cancer Maternal Aunt    Diabetes Maternal Uncle    Heart disease Maternal Uncle    Breast cancer Maternal Grandmother    Pancreatic cancer Maternal Grandmother    Cancer Maternal Grandmother    Prostate cancer Cousin    Stomach cancer Paternal Aunt    Depression Mother    Early death Mother    Heart attack Mother    Cancer Father    COPD Father    Hearing loss Father    Arthritis Brother    Alcohol abuse Daughter    Depression Daughter    Diabetes Daughter    COPD Paternal Grandmother    Hearing loss Brother    Heart attack Brother    Alcohol abuse Brother    Asthma Brother    COPD Brother    Depression Brother    Colon cancer Neg Hx    Celiac disease Neg Hx    Cirrhosis Neg Hx    Clotting disorder Neg Hx    Colitis Neg Hx    Colon polyps Neg Hx    Crohn's disease Neg Hx    Cystic fibrosis Neg Hx    Esophageal cancer Neg Hx    Hemochromatosis Neg Hx    Inflammatory bowel  disease Neg Hx    Irritable bowel syndrome Neg Hx    Kidney disease Neg Hx    Liver cancer Neg Hx    Liver disease Neg Hx    Ovarian cancer Neg Hx    Rectal cancer Neg Hx    Ulcerative colitis Neg Hx    Uterine cancer Neg Hx    Wilson's disease Neg Hx     Social History   Socioeconomic History   Marital status: Married    Spouse name: Not on file   Number of children: Not on file   Years of education: Not on file   Highest education level: Not on file  Occupational History   Not on file  Tobacco Use   Smoking status: Former    Pack years: 0.00   Smokeless tobacco: Never   Tobacco comments:    quit when i was in the service  Vaping Use   Vaping Use: Never used  Substance and Sexual Activity   Alcohol use: Yes    Alcohol/week: 7.0 - 9.0  standard drinks    Types: 7 - 9 Cans of beer per week   Drug use: No   Sexual activity: Yes  Other Topics Concern   Not on file  Social History Narrative   Not on file   Social Determinants of Health   Financial Resource Strain: Not on file  Food Insecurity: Not on file  Transportation Needs: Not on file  Physical Activity: Not on file  Stress: Not on file  Social Connections: Not on file  Intimate Partner Violence: Not on file    Outpatient Medications Prior to Visit  Medication Sig Dispense Refill   amLODipine (NORVASC) 5 MG tablet TAKE 1 TABLET BY MOUTH DAILY 90 tablet 0   atorvastatin (LIPITOR) 20 MG tablet TAKE 1 TABLET(20 MG) BY MOUTH DAILY 90 tablet 1   calcium-vitamin D (OSCAL 500/200 D-3) 500-200 MG-UNIT tablet Take 1 tablet by mouth 2 (two) times daily. 180 tablet 5   carbamide peroxide (DEBROX) 6.5 % OTIC solution Place 5 drops into both ears 2 (two) times daily. 15 mL 0   co-enzyme Q-10 30 MG capsule Take 300 mg by mouth every other day.      sildenafil (REVATIO) 20 MG tablet TAKE 3-5 TABLETS BY MOUTH DAILY 45 MINUTES PRIOR TO INTERCOURSE AS NEEDED 50 tablet 1   No facility-administered medications prior to visit.     Allergies  Allergen Reactions   Lisinopril Cough   Penicillins    Propoxyphene N-Acetaminophen     ROS Review of Systems  Constitutional: Negative.   HENT:  Positive for ear discharge and hearing loss.   Respiratory: Negative.    Cardiovascular: Negative.   Gastrointestinal: Negative.   Genitourinary: Negative.   Musculoskeletal:  Positive for myalgias.  Psychiatric/Behavioral: Negative.       Objective:    Physical Exam Vitals and nursing note reviewed.  Constitutional:      General: He is not in acute distress.    Appearance: Normal appearance. He is not ill-appearing, toxic-appearing or diaphoretic.  HENT:     Head: Normocephalic and atraumatic.     Right Ear: External ear normal. There is impacted cerumen.     Left Ear: External ear normal. There is impacted cerumen.  Eyes:     General: No scleral icterus.       Right eye: No discharge.        Left eye: No discharge.     Conjunctiva/sclera: Conjunctivae normal.  Skin:    General: Skin is warm and dry.  Neurological:     Mental Status: He is alert and oriented to person, place, and time.    BP (!) 142/78   Pulse 81   Temp (!) 96.9 F (36.1 C) (Temporal)   Ht 5\' 10"  (1.778 m)   Wt 187 lb (84.8 kg)   SpO2 97%   BMI 26.83 kg/m  Wt Readings from Last 3 Encounters:  05/24/21 187 lb (84.8 kg)  05/17/21 188 lb 3.2 oz (85.4 kg)  10/05/20 189 lb (85.7 kg)   Subjective:    Collin Garza is a 70 y.o. male whom I am asked to see for evaluation of diminished hearing in both ears for the past 6 month. There is a prior history of cerumen impaction. The patient has been using ear drops to loosen wax immediately prior to this visit. The patient complains of ear pain.  The patient's history has been marked as reviewed and updated as appropriate.  Review of Systems Pertinent items are noted in  HPI.    Objective:    Auditory canal(s) of both ears are completely obstructed with cerumen.   Cerumen was removed  using gentle irrigation. Tympanic membranes are intact following the procedure.  Auditory canals are normal.    Assessment:    Cerumen Impaction without otitis externa.    Plan:    1. Care instructions given. 2. Home treatment: none. 3. Follow-up as needed.    Health Maintenance Due  Topic Date Due   Zoster Vaccines- Shingrix (1 of 2) Never done    There are no preventive care reminders to display for this patient.  Lab Results  Component Value Date   TSH 0.81 12/12/2017   Lab Results  Component Value Date   WBC 7.4 05/17/2021   HGB 14.5 05/17/2021   HCT 42.2 05/17/2021   MCV 92.3 05/17/2021   PLT 263.0 05/17/2021   Lab Results  Component Value Date   NA 139 05/17/2021   K 4.2 05/17/2021   CO2 27 05/17/2021   GLUCOSE 102 (H) 05/17/2021   BUN 12 05/17/2021   CREATININE 0.79 05/17/2021   BILITOT 0.7 05/17/2021   ALKPHOS 61 05/17/2021   AST 18 05/17/2021   ALT 18 05/17/2021   PROT 6.6 05/17/2021   ALBUMIN 4.4 05/17/2021   CALCIUM 9.4 05/17/2021   GFR 91.80 05/17/2021   Lab Results  Component Value Date   CHOL 159 05/17/2021   Lab Results  Component Value Date   HDL 71.70 05/17/2021   Lab Results  Component Value Date   LDLCALC 69 05/17/2021   Lab Results  Component Value Date   TRIG 91.0 05/17/2021   Lab Results  Component Value Date   CHOLHDL 2 05/17/2021   Lab Results  Component Value Date   HGBA1C 5.3 06/17/2018      Assessment & Plan:   Problem List Items Addressed This Visit       Nervous and Auditory   Impacted cerumen of left ear     Other   HYPERLIPIDEMIA - Primary   Elevated PSA   Relevant Orders   Ambulatory referral to Urology    No orders of the defined types were placed in this encounter.   Follow-up: Return As directed originally.  Continue Debrox use.  Avoid Q-tips..   We had a 15-minute discussion about statin use.  While his A ASCVD score is elevated slightly his HDL level is favorable.  Do believe that his  statin could improve his long-term longevity but I also added that it was up to him about whether or not he would take it.  He is having some muscle pains on either side of his posterior chest wall.  Advised that he could stop the statin for a few weeks to see if these pains improved.  He could then restart the statin and take it every other day if tolerated. Libby Maw, MD

## 2021-07-26 DIAGNOSIS — N5201 Erectile dysfunction due to arterial insufficiency: Secondary | ICD-10-CM | POA: Diagnosis not present

## 2021-07-26 DIAGNOSIS — R972 Elevated prostate specific antigen [PSA]: Secondary | ICD-10-CM | POA: Diagnosis not present

## 2021-08-15 ENCOUNTER — Other Ambulatory Visit (HOSPITAL_BASED_OUTPATIENT_CLINIC_OR_DEPARTMENT_OTHER): Payer: Self-pay

## 2021-09-30 ENCOUNTER — Other Ambulatory Visit: Payer: Self-pay | Admitting: Family Medicine

## 2021-09-30 DIAGNOSIS — E782 Mixed hyperlipidemia: Secondary | ICD-10-CM

## 2021-09-30 NOTE — Telephone Encounter (Signed)
Chart supports rx refill Last ov: 05/24/2021 Last refill: 09/29/2021

## 2021-10-18 DIAGNOSIS — R972 Elevated prostate specific antigen [PSA]: Secondary | ICD-10-CM | POA: Diagnosis not present

## 2021-10-25 DIAGNOSIS — R972 Elevated prostate specific antigen [PSA]: Secondary | ICD-10-CM | POA: Diagnosis not present

## 2021-10-25 DIAGNOSIS — N5201 Erectile dysfunction due to arterial insufficiency: Secondary | ICD-10-CM | POA: Diagnosis not present

## 2021-11-02 ENCOUNTER — Other Ambulatory Visit: Payer: Self-pay | Admitting: Urology

## 2021-11-02 DIAGNOSIS — R972 Elevated prostate specific antigen [PSA]: Secondary | ICD-10-CM

## 2021-11-05 ENCOUNTER — Other Ambulatory Visit: Payer: Self-pay | Admitting: Family

## 2021-11-05 DIAGNOSIS — I10 Essential (primary) hypertension: Secondary | ICD-10-CM

## 2021-11-16 ENCOUNTER — Other Ambulatory Visit: Payer: Self-pay

## 2021-11-16 ENCOUNTER — Encounter: Payer: Self-pay | Admitting: Family Medicine

## 2021-11-16 ENCOUNTER — Ambulatory Visit (INDEPENDENT_AMBULATORY_CARE_PROVIDER_SITE_OTHER): Payer: Medicare Other | Admitting: Family Medicine

## 2021-11-16 VITALS — BP 140/90 | Ht 70.0 in | Wt 173.2 lb

## 2021-11-16 DIAGNOSIS — R1032 Left lower quadrant pain: Secondary | ICD-10-CM

## 2021-11-16 DIAGNOSIS — I10 Essential (primary) hypertension: Secondary | ICD-10-CM | POA: Diagnosis not present

## 2021-11-16 DIAGNOSIS — K4091 Unilateral inguinal hernia, without obstruction or gangrene, recurrent: Secondary | ICD-10-CM

## 2021-11-16 DIAGNOSIS — E782 Mixed hyperlipidemia: Secondary | ICD-10-CM

## 2021-11-16 DIAGNOSIS — E559 Vitamin D deficiency, unspecified: Secondary | ICD-10-CM | POA: Diagnosis not present

## 2021-11-16 LAB — LIPID PANEL
Cholesterol: 190 mg/dL (ref 0–200)
HDL: 87.6 mg/dL (ref >39.00)
LDL Cholesterol: 73 mg/dL (ref 0–99)
NonHDL: 102.2
Total CHOL/HDL Ratio: 2
Triglycerides: 148 mg/dL (ref 0.0–149.0)
VLDL: 29.6 mg/dL (ref 0.0–40.0)

## 2021-11-16 LAB — COMPREHENSIVE METABOLIC PANEL
ALT: 19 U/L (ref 0–53)
AST: 22 U/L (ref 0–37)
Albumin: 4.7 g/dL (ref 3.5–5.2)
Alkaline Phosphatase: 59 U/L (ref 39–117)
BUN: 12 mg/dL (ref 6–23)
CO2: 25 mEq/L (ref 19–32)
Calcium: 10.1 mg/dL (ref 8.4–10.5)
Chloride: 97 mEq/L (ref 96–112)
Creatinine, Ser: 0.75 mg/dL (ref 0.40–1.50)
GFR: 92.92 mL/min (ref >60.00)
Glucose, Bld: 112 mg/dL — ABNORMAL HIGH (ref 70–99)
Potassium: 3.9 mEq/L (ref 3.5–5.1)
Sodium: 132 mEq/L — ABNORMAL LOW (ref 135–145)
Total Bilirubin: 1.3 mg/dL — ABNORMAL HIGH (ref 0.2–1.2)
Total Protein: 7.3 g/dL (ref 6.0–8.3)

## 2021-11-16 LAB — CBC
HCT: 46.9 % (ref 39.0–52.0)
Hemoglobin: 16.1 g/dL (ref 13.0–17.0)
MCHC: 34.2 g/dL (ref 30.0–36.0)
MCV: 95.2 fl (ref 78.0–100.0)
Platelets: 244 10*3/uL (ref 150.0–400.0)
RBC: 4.93 Mil/uL (ref 4.22–5.81)
RDW: 13.3 % (ref 11.5–15.5)
WBC: 8.5 10*3/uL (ref 4.0–10.5)

## 2021-11-16 LAB — VITAMIN D 25 HYDROXY (VIT D DEFICIENCY, FRACTURES): VITD: 33.75 ng/mL (ref 30.00–100.00)

## 2021-11-16 LAB — LDL CHOLESTEROL, DIRECT: Direct LDL: 91 mg/dL

## 2021-11-16 NOTE — Addendum Note (Signed)
Addended by: Lynnea Ferrier on: 11/16/2021 01:07 PM   Modules accepted: Orders

## 2021-11-16 NOTE — Progress Notes (Signed)
Established Patient Office Visit  Subjective:  Patient ID: Collin Garza, male    DOB: 01-Jan-1951  Age: 70 y.o. MRN: [id number]  CC:  Chief Complaint  Patient presents with   Follow-up    6 mo f/u. Pt is fasting.     HPI Collin Garza presents for follow-up of hypertension, elevated cholesterol vitamin D deficiency.  He has been tolerating atorvastatin and taking it.  Blood pressures been controlled well on current regimen.  He has been experiencing left lower quadrant pain.  There is an occasional bulge in his inguinal area.  The pain has been significant enough to where he has thought about proceeding to the emergency room.  Denies constipation or blood in his stool.  Denies hematochezia.  He is scheduled for an MRI of the prostate secondary to PSA velocity.  There will be a fusion biopsy scheduled.  Past Medical History:  Diagnosis Date   Arthritis    LEFT HIP AND SHOULDER   Depression    Hypertension     Past Surgical History:  Procedure Laterality Date   COLONOSCOPY     INGUINAL HERNIA REPAIR     x3    Family History  Problem Relation Age of Onset   Breast cancer Maternal Aunt    Diabetes Maternal Uncle    Heart disease Maternal Uncle    Breast cancer Maternal Grandmother    Pancreatic cancer Maternal Grandmother    Cancer Maternal Grandmother    Prostate cancer Cousin    Stomach cancer Paternal Aunt    Depression Mother    Early death Mother    Heart attack Mother    Cancer Father    COPD Father    Hearing loss Father    Arthritis Brother    Alcohol abuse Daughter    Depression Daughter    Diabetes Daughter    COPD Paternal Grandmother    Hearing loss Brother    Heart attack Brother    Alcohol abuse Brother    Asthma Brother    COPD Brother    Depression Brother    Colon cancer Neg Hx    Celiac disease Neg Hx    Cirrhosis Neg Hx    Clotting disorder Neg Hx    Colitis Neg Hx    Colon polyps Neg Hx    Crohn's disease Neg Hx    Cystic fibrosis Neg  Hx    Esophageal cancer Neg Hx    Hemochromatosis Neg Hx    Inflammatory bowel disease Neg Hx    Irritable bowel syndrome Neg Hx    Kidney disease Neg Hx    Liver cancer Neg Hx    Liver disease Neg Hx    Ovarian cancer Neg Hx    Rectal cancer Neg Hx    Ulcerative colitis Neg Hx    Uterine cancer Neg Hx    Wilson's disease Neg Hx     Social History   Socioeconomic History   Marital status: Married    Spouse name: Not on file   Number of children: Not on file   Years of education: Not on file   Highest education level: Not on file  Occupational History   Not on file  Tobacco Use   Smoking status: Former   Smokeless tobacco: Never   Tobacco comments:    quit when i was in the service  Vaping Use   Vaping Use: Never used  Substance and Sexual Activity   Alcohol use: Yes  Alcohol/week: 7.0 - 9.0 standard drinks    Types: 7 - 9 Cans of beer per week   Drug use: No   Sexual activity: Yes  Other Topics Concern   Not on file  Social History Narrative   Not on file   Social Determinants of Health   Financial Resource Strain: Not on file  Food Insecurity: Not on file  Transportation Needs: Not on file  Physical Activity: Not on file  Stress: Not on file  Social Connections: Not on file  Intimate Partner Violence: Not on file    Outpatient Medications Prior to Visit  Medication Sig Dispense Refill   amLODipine (NORVASC) 5 MG tablet TAKE 1 TABLET BY MOUTH DAILY 90 tablet 0   atorvastatin (LIPITOR) 20 MG tablet TAKE 1 TABLET(20 MG) BY MOUTH DAILY 90 tablet 1   calcium-vitamin D (OSCAL 500/200 D-3) 500-200 MG-UNIT tablet Take 1 tablet by mouth 2 (two) times daily. 180 tablet 5   co-enzyme Q-10 30 MG capsule Take 300 mg by mouth every other day.      sildenafil (VIAGRA) 100 MG tablet Take 50 mg by mouth daily as needed.     carbamide peroxide (DEBROX) 6.5 % OTIC solution Place 5 drops into both ears 2 (two) times daily. 15 mL 0   sildenafil (REVATIO) 20 MG tablet TAKE  3-5 TABLETS BY MOUTH DAILY 45 MINUTES PRIOR TO INTERCOURSE AS NEEDED 50 tablet 1   No facility-administered medications prior to visit.    Allergies  Allergen Reactions   Lisinopril Cough   Penicillins    Propoxyphene N-Acetaminophen     ROS Review of Systems  Constitutional:  Negative for chills, diaphoresis, fatigue, fever and unexpected weight change.  HENT: Negative.    Eyes:  Negative for photophobia and visual disturbance.  Respiratory: Negative.    Cardiovascular: Negative.   Gastrointestinal:  Positive for abdominal pain. Negative for anal bleeding, blood in stool, constipation, diarrhea, nausea and vomiting.  Endocrine: Negative for polyphagia and polyuria.  Genitourinary:  Negative for difficulty urinating, frequency and urgency.  Musculoskeletal:  Negative for gait problem and joint swelling.  Skin:  Negative for pallor and rash.  Neurological:  Negative for speech difficulty and weakness.  Psychiatric/Behavioral: Negative.       Objective:    Physical Exam Vitals and nursing note reviewed.  Constitutional:      General: He is not in acute distress.    Appearance: Normal appearance. He is normal weight. He is not ill-appearing, toxic-appearing or diaphoretic.  HENT:     Head: Normocephalic and atraumatic.     Right Ear: Tympanic membrane, ear canal and external ear normal.     Left Ear: Tympanic membrane, ear canal and external ear normal.     Mouth/Throat:     Mouth: Mucous membranes are moist.     Pharynx: Oropharynx is clear. No oropharyngeal exudate or posterior oropharyngeal erythema.  Neck:     Vascular: No carotid bruit.  Cardiovascular:     Rate and Rhythm: Normal rate and regular rhythm.  Pulmonary:     Effort: Pulmonary effort is normal.     Breath sounds: Normal breath sounds.  Abdominal:     General: There is no distension.     Palpations: There is no mass.     Tenderness: There is no abdominal tenderness. There is no guarding or rebound.      Hernia: A hernia is present. Hernia is present in the left inguinal area (incarcerated?). There is no hernia  in the right inguinal area.  Genitourinary:    Penis: No hypospadias, erythema, tenderness, discharge, swelling or lesions.      Testes:        Right: Mass or tenderness not present.        Left: Mass or tenderness not present.     Epididymis:     Right: Not inflamed or enlarged.     Left: Not inflamed or enlarged.  Musculoskeletal:     Cervical back: No rigidity or tenderness.     Right lower leg: No edema.     Left lower leg: No edema.  Lymphadenopathy:     Cervical: No cervical adenopathy.     Lower Body: No right inguinal adenopathy. No left inguinal adenopathy.  Skin:    General: Skin is warm and dry.  Neurological:     Mental Status: He is alert and oriented to person, place, and time.  Psychiatric:        Mood and Affect: Mood normal.        Behavior: Behavior normal.    Ht 5\' 10"  (1.778 m)    Wt 173 lb 3.2 oz (78.6 kg)    BMI 24.85 kg/m  Wt Readings from Last 3 Encounters:  11/16/21 173 lb 3.2 oz (78.6 kg)  05/24/21 187 lb (84.8 kg)  05/17/21 188 lb 3.2 oz (85.4 kg)     Health Maintenance Due  Topic Date Due   Zoster Vaccines- Shingrix (1 of 2) Never done   COVID-19 Vaccine (5 - Booster for Pfizer series) 06/28/2021    There are no preventive care reminders to display for this patient.  Lab Results  Component Value Date   TSH 0.81 12/12/2017   Lab Results  Component Value Date   WBC 7.4 05/17/2021   HGB 14.5 05/17/2021   HCT 42.2 05/17/2021   MCV 92.3 05/17/2021   PLT 263.0 05/17/2021   Lab Results  Component Value Date   NA 139 05/17/2021   K 4.2 05/17/2021   CO2 27 05/17/2021   GLUCOSE 102 (H) 05/17/2021   BUN 12 05/17/2021   CREATININE 0.79 05/17/2021   BILITOT 0.7 05/17/2021   ALKPHOS 61 05/17/2021   AST 18 05/17/2021   ALT 18 05/17/2021   PROT 6.6 05/17/2021   ALBUMIN 4.4 05/17/2021   CALCIUM 9.4 05/17/2021   GFR 91.80  05/17/2021   Lab Results  Component Value Date   CHOL 159 05/17/2021   Lab Results  Component Value Date   HDL 71.70 05/17/2021   Lab Results  Component Value Date   LDLCALC 69 05/17/2021   Lab Results  Component Value Date   TRIG 91.0 05/17/2021   Lab Results  Component Value Date   CHOLHDL 2 05/17/2021   Lab Results  Component Value Date   HGBA1C 5.3 06/17/2018      Assessment & Plan:   Problem List Items Addressed This Visit       Cardiovascular and Mediastinum   Essential hypertension   Relevant Medications   sildenafil (VIAGRA) 100 MG tablet   Other Relevant Orders   CBC   Comprehensive metabolic panel   Urinalysis, Routine w reflex microscopic     Other   HYPERLIPIDEMIA - Primary   Relevant Medications   sildenafil (VIAGRA) 100 MG tablet   Other Relevant Orders   CBC   Comprehensive metabolic panel   LDL cholesterol, direct   Lipid panel   Vitamin D deficiency   Relevant Orders   VITAMIN D 25  Hydroxy (Vit-D Deficiency, Fractures)   Unilateral recurrent inguinal hernia without obstruction or gangrene   Relevant Orders   Ambulatory referral to General Surgery   Other Visit Diagnoses     Left lower quadrant abdominal pain       Relevant Orders   CBC   Comprehensive metabolic panel   CT Abdomen Pelvis Wo Contrast   CT Abdomen Pelvis W Contrast   Ambulatory referral to General Surgery       No orders of the defined types were placed in this encounter.   Follow-up: Return in about 3 months (around 02/14/2022), or if symptoms worsen or fail to improve, for To emergency room if worse..  Blood work checked today.  Have ordered CT scans.  Have requested emergent surgical referral.  He will proceed to the emergency room if symptoms worsen.  Information was given on abdominal pain we discussed biopsy likely sequelae  Libby Maw, MD

## 2021-12-02 ENCOUNTER — Other Ambulatory Visit: Payer: Self-pay

## 2021-12-02 ENCOUNTER — Other Ambulatory Visit (INDEPENDENT_AMBULATORY_CARE_PROVIDER_SITE_OTHER): Payer: Medicare Other

## 2021-12-02 DIAGNOSIS — I10 Essential (primary) hypertension: Secondary | ICD-10-CM | POA: Diagnosis not present

## 2021-12-02 LAB — URINALYSIS, ROUTINE W REFLEX MICROSCOPIC
Bilirubin Urine: NEGATIVE
Hgb urine dipstick: NEGATIVE
Ketones, ur: NEGATIVE
Leukocytes,Ua: NEGATIVE
Nitrite: NEGATIVE
RBC / HPF: NONE SEEN (ref 0–?)
Specific Gravity, Urine: 1.01 (ref 1.000–1.030)
Total Protein, Urine: NEGATIVE
Urine Glucose: NEGATIVE
Urobilinogen, UA: 0.2 (ref 0.0–1.0)
pH: 5.5 (ref 5.0–8.0)

## 2021-12-02 NOTE — Progress Notes (Signed)
Perthe orders of Dr.kremer pt is here a repeat urine, pt was able to provide adequate amount of urine

## 2021-12-06 ENCOUNTER — Other Ambulatory Visit: Payer: Self-pay

## 2021-12-06 ENCOUNTER — Ambulatory Visit (HOSPITAL_BASED_OUTPATIENT_CLINIC_OR_DEPARTMENT_OTHER)
Admission: RE | Admit: 2021-12-06 | Discharge: 2021-12-06 | Disposition: A | Discharge status: Home / Self Care | Payer: Medicare Other | Source: Ambulatory Visit | Attending: Family Medicine | Admitting: Family Medicine

## 2021-12-06 DIAGNOSIS — K4090 Unilateral inguinal hernia, without obstruction or gangrene, not specified as recurrent: Secondary | ICD-10-CM | POA: Diagnosis not present

## 2021-12-06 DIAGNOSIS — R1032 Left lower quadrant pain: Secondary | ICD-10-CM | POA: Insufficient documentation

## 2021-12-06 DIAGNOSIS — K439 Ventral hernia without obstruction or gangrene: Secondary | ICD-10-CM | POA: Diagnosis not present

## 2021-12-06 DIAGNOSIS — N323 Diverticulum of bladder: Secondary | ICD-10-CM | POA: Diagnosis not present

## 2021-12-06 DIAGNOSIS — K8020 Calculus of gallbladder without cholecystitis without obstruction: Secondary | ICD-10-CM | POA: Diagnosis not present

## 2021-12-07 ENCOUNTER — Ambulatory Visit: Payer: Medicare Other | Attending: Internal Medicine

## 2021-12-07 ENCOUNTER — Ambulatory Visit: Payer: Medicare Other

## 2021-12-07 DIAGNOSIS — Z23 Encounter for immunization: Secondary | ICD-10-CM

## 2021-12-08 ENCOUNTER — Other Ambulatory Visit (HOSPITAL_BASED_OUTPATIENT_CLINIC_OR_DEPARTMENT_OTHER): Payer: Self-pay

## 2021-12-08 MED ORDER — PFIZER COVID-19 VAC BIVALENT 30 MCG/0.3ML IM SUSP
INTRAMUSCULAR | 0 refills | Status: DC
  Filled 2021-12-08: qty 0.3, 1d supply, fill #0

## 2021-12-08 NOTE — Progress Notes (Signed)
° °  Covid-19 Vaccination Clinic  Name:  Collin Garza    MRN: [id number] DOB: 1951-09-03  12/08/2021  [name] was observed post Covid-19 immunization for 15 minutes without incident. He was provided with Vaccine Information Sheet and instruction to access the V-Safe system.   [name] was instructed to call 911 with any severe reactions post vaccine: Difficulty breathing  Swelling of face and throat  A fast heartbeat  A bad rash all over body  Dizziness and weakness   Immunizations Administered     Name Date Dose VIS Date Route   Pfizer Covid-19 Vaccine Bivalent Booster 12/07/2021  9:51 AM 0.3 mL 08/03/2021 Intramuscular   Manufacturer: [place]   Lot: [id number]   [place]: [phone]

## 2021-12-13 ENCOUNTER — Telehealth: Payer: Self-pay | Admitting: Family Medicine

## 2021-12-13 NOTE — Telephone Encounter (Signed)
Spoke with patient who calling regarding referral to general surgery per patient he have not heard from no one to schedule appointment for hernia. Number to [facility] provided patient aware that he can give them a call to schedule.

## 2021-12-13 NOTE — Telephone Encounter (Signed)
Pt is wanting a call back concerning a surgical referral he is suppose to be getting. Please advise at  [phone].

## 2022-01-03 DIAGNOSIS — N323 Diverticulum of bladder: Secondary | ICD-10-CM | POA: Diagnosis not present

## 2022-01-03 DIAGNOSIS — R972 Elevated prostate specific antigen [PSA]: Secondary | ICD-10-CM | POA: Diagnosis not present

## 2022-01-19 ENCOUNTER — Ambulatory Visit: Payer: Self-pay | Admitting: General Surgery

## 2022-01-19 DIAGNOSIS — K4091 Unilateral inguinal hernia, without obstruction or gangrene, recurrent: Secondary | ICD-10-CM | POA: Diagnosis not present

## 2022-01-19 NOTE — H&P (Signed)
Chief Complaint: Hernia       History of Present Illness: Collin Garza is a 70 y.o. male who is seen today as an office consultation at the request of Dr. Louis Meckel for evaluation of Hernia .   Patient is a 71 year old male who comes in with history of hypertension, hyperlipidemia a recurrent left inguinal hernia and bladder diverticulum. Patient has been seen by Dr. Louis Meckel for evaluation of excision of bladder diverticulum.  Patient has a recurrent left inguinal hernia.  He would like to have this repaired concurrently.   He states that hernias been there for several years.  Has gotten larger.  He does go to the gym does do some weight lifting.  He states he is able to massage the hernia back without any trouble.  He had no signs or symptoms of incarceration or strangulation.         Review of Systems: A complete review of systems was obtained from the patient.  I have reviewed this information and discussed as appropriate with the patient.  See HPI as well for other ROS.   ROS      Medical History: Past Medical History Past Medical History: Diagnosis Date  Arthritis    Hypertension        There is no problem list on file for this patient.     Past Surgical History Past Surgical History: Procedure Laterality Date  HERNIA REPAIR          Allergies Allergies Allergen Reactions  Propoxyphene N-Acetaminophen Nausea     Other reaction(s): NAUSEA, VOMITING    Penicillins Rash      Current Outpatient Medications on File Prior to Visit Medication Sig Dispense Refill  amLODIPine (NORVASC) 5 MG tablet        ascorbic acid, vitamin C, (VITAMIN C) 500 MG tablet Take 500 mg by mouth once daily      atorvastatin (LIPITOR) 20 MG tablet        calcium citrate-vitamin D3 (CITRACAL+D) 315 mg-5 mcg (200 unit) tablet Take 2 tablets by mouth 2 (two) times daily with meals      cholecalciferol (VITAMIN D3) 2,000 unit capsule Take 2,000 Units by mouth once daily      sildenafiL  (VIAGRA) 100 MG tablet TAKE 1/2 TABLET BY MOUTH EVERY DAY AS NEEDED       No current facility-administered medications on file prior to visit.     Family History Family History Problem Relation Age of Onset  Breast cancer Sister        Social History   Tobacco Use Smoking Status Former  Types: Cigarettes Smokeless Tobacco Never     Social History Social History    Socioeconomic History  Marital status: Married Tobacco Use  Smoking status: Former     Types: Cigarettes  Smokeless tobacco: Never Vaping Use  Vaping Use: Never used Substance and Sexual Activity  Alcohol use: Yes  Drug use: Never      Objective:     Vitals:   01/19/22 0835 BP: (!) 142/80 Pulse: 81 Temp: 36.7 C (98 F) SpO2: 98% Weight: 79.5 kg (175 lb 3.2 oz) Height: 177.8 cm (5\' 10" )   Body mass index is 25.14 kg/m. Physical Exam Constitutional:      Appearance: Normal appearance.  HENT:     Head: Normocephalic and atraumatic.     Nose: Nose normal. No congestion.     Mouth/Throat:     Mouth: Mucous membranes are moist.     Pharynx: Oropharynx is  clear.  Eyes:     Pupils: Pupils are equal, round, and reactive to light.  Cardiovascular:     Rate and Rhythm: Normal rate and regular rhythm.     Pulses: Normal pulses.     Heart sounds: Normal heart sounds. No murmur heard.   No friction rub. No gallop.  Pulmonary:     Effort: Pulmonary effort is normal. No respiratory distress.     Breath sounds: Normal breath sounds. No stridor. No wheezing, rhonchi or rales.  Abdominal:     General: Abdomen is flat.     Hernia: A hernia is present. Hernia is present in the left inguinal area. There is no hernia in the right inguinal area.  Musculoskeletal:        General: Normal range of motion.     Cervical back: Normal range of motion.  Skin:    General: Skin is warm and dry.  Neurological:     General: No focal deficit present.     Mental Status: He is alert and oriented to person, place,  and time.  Psychiatric:        Mood and Affect: Mood normal.        Thought Content: Thought content normal.          Assessment and Plan: Diagnoses and all orders for this visit:   Unilateral recurrent inguinal hernia without obstruction or gangrene     Collin Garza is a 71 y.o. male    1.  We will proceed to the OR for a robotic left inguinal hernia repair with mesh.  We will coordinate this with Dr. Louis Meckel. 2. All risks and benefits were discussed with the patient, to generally include infection, bleeding, damage to surrounding structures, acute and chronic nerve pain, and recurrence. Alternatives were offered and described.  All questions were answered and the patient voiced understanding of the procedure and wishes to proceed at this point.             No follow-ups on file.   Ralene Ok, MD, [facility], [place] [facility]

## 2022-01-26 ENCOUNTER — Other Ambulatory Visit: Payer: Self-pay | Admitting: Urology

## 2022-02-06 NOTE — Patient Instructions (Addendum)
DUE TO COVID-19 ONLY ONE VISITOR IS ALLOWED TO COME WITH YOU AND STAY IN THE WAITING ROOM ONLY DURING PRE OP AND PROCEDURE.   ?**NO VISITORS ARE ALLOWED IN THE SHORT STAY AREA OR RECOVERY ROOM!!** ? ?You are not required to quarantine, however you are required to wear a well-fitted mask when you are out and around people not in your household.  ?Hand Hygiene often ?Do NOT share personal items ?Notify your provider if you are in close contact with someone who has COVID or you develop fever 100.4 or greater, new onset of sneezing, cough, sore throat, shortness of breath or body aches. ? ?     ? Your procedure is scheduled on: Thursday, 02-23-22 ? ? Report to [facility] Main Entrance ? ?  Report to admitting at 5:15 AM ? ? Call this number if you have problems the morning of surgery [phone] ? ? Do not eat food :After Midnight. ? ? After Midnight you may have the following liquids until 4:30 AM DAY OF SURGERY ? ?Water ?Black Coffee (sugar ok, NO MILK/CREAM OR CREAMERS)  ?Tea (sugar ok, NO MILK/CREAM OR CREAMERS) regular and decaf                             ?Plain Jell-O (NO RED)                                           ?Fruit ices (not with fruit pulp, NO RED)                                     ?Popsicles (NO RED)                                                                  ?Juice: apple, WHITE grape, WHITE cranberry ?Sports drinks like Gatorade (NO RED) ?Clear broth(vegetable,chicken,beef) ? ?             ?Drink one Ensure drink at 4:30 AM the morning of surgery.   ?  ?  ?The day of surgery:  ?Drink ONE (1) Pre-Surgery Clear Ensure the morning of surgery. Drink in one sitting. Do not sip.  ?This drink was given to you during your hospital  ?pre-op appointment visit. ?Nothing else to drink after completing the Pre-Surgery Clear Ensure  ?       If you have questions, please contact your surgeon?s office. ? ? ?FOLLOW  ANY ADDITIONAL PRE OP INSTRUCTIONS YOU RECEIVED FROM YOUR SURGEON'S OFFICE!!! ?  ?   ?Oral Hygiene is also important to reduce your risk of infection.                                    ?Remember - BRUSH YOUR TEETH THE MORNING OF SURGERY WITH YOUR REGULAR TOOTHPASTE ? ? Do NOT smoke after Midnight ? ? Take these medicines the morning of surgery with A SIP OF WATER:  None ?                  ?  You may not have any metal on your body including jewelry, and body piercing ? ?           Do not wear lotions, powders, cologne, or deodorant ? ?            Men may shave face and neck. ? ? Do not bring valuables to the hospital. [place]. ? ? Contacts, dentures or bridgework may not be worn into surgery. ?   ?Patients discharged on the day of surgery will not be allowed to drive home.  Someone NEEDS to stay with you for the first 24 hours after anesthesia. ? ?Special Instructions: Bring a copy of your healthcare power of attorney and living will documents  the day of surgery if you haven't scanned them before. ? ?Please read over the following fact sheets you were given: IF [place]  ? ?   [place] - Preparing for Surgery ?Before surgery, you can play an important role.  Because skin is not sterile, your skin needs to be as free of germs as possible.  You can reduce the number of germs on your skin by washing with CHG (chlorahexidine gluconate) soap before surgery.  CHG is an antiseptic cleaner which kills germs and bonds with the skin to continue killing germs even after washing. ?Please DO NOT use if you have an allergy to CHG or antibacterial soaps.  If your skin becomes reddened/irritated stop using the CHG and inform your nurse when you arrive at Short Stay. ?Do not shave (including legs and underarms) for at least 48 hours prior to the first CHG shower.  You may shave your face/neck. ? ?Please follow these instructions carefully: ? 1.  Shower with CHG Soap the night before surgery  and the  morning of surgery. ? 2.  If you choose to wash your hair, wash your hair first as usual with your normal  shampoo. ? 3.  After you shampoo, rinse your hair and body thoroughly to remove the shampoo.                            ? 4.  Use CHG as you would any other liquid soap.  You can apply chg directly to the skin and wash.  Gently with a scrungie or clean washcloth. ? 5.  Apply the CHG Soap to your body ONLY FROM THE NECK DOWN.   Do   not use on face/ open      ?                     Wound or open sores. Avoid contact with eyes, ears mouth and   genitals (private parts).  ?                     Production manager,  Genitals (private parts) with your normal soap. ?            6.  Wash thoroughly, paying special attention to the area where your    surgery  will be performed. ? 7.  Thoroughly rinse your body with warm water from the neck down. ? 8.  DO NOT shower/wash with your normal soap after using and rinsing off the CHG Soap. ?               9.  Pat yourself  dry with a clean towel. ?           10.  Wear clean pajamas. ?           11.  Place clean sheets on your bed the night of your first shower and do not  sleep with pets. ?Day of Surgery : ?Do not apply any lotions/deodorants the morning of surgery.  Please wear clean clothes to the hospital/surgery center. ? ?FAILURE TO FOLLOW THESE INSTRUCTIONS MAY RESULT IN THE CANCELLATION OF YOUR SURGERY ? ?PATIENT SIGNATURE_________________________________ ? ?NURSE SIGNATURE__________________________________ ? ?________________________________________________________________________  ?

## 2022-02-06 NOTE — Progress Notes (Addendum)
COVID swab appointment: N/A ? ?COVID Vaccine Completed:  Yes x2  ?Date COVID Vaccine completed: 02-02-20 03-02-20 ?Has received booster:  Yes x3 09-18-20 12-07-21 ?COVID vaccine manufacturer: Pfizer     ? ?Date of COVID positive in last 90 days:  No ? ?PCP - Abelino Derrick, MD ?Cardiologist - N/A ? ?Chest x-ray - N/A ?EKG - 02-08-22 Epic ?Stress Test - 15+ years ago ?ECHO - N/A ?Cardiac Cath - N/A ?Pacemaker/ICD device last checked: ?Spinal Cord Stimulator: ? ?Bowel Prep - N/A ? ?Sleep Study - N/A ?CPAP -  ? ?Fasting Blood Sugar - N/A ?Checks Blood Sugar _____ times a day ? ?Blood Thinner Instructions:  N/A ?Aspirin Instructions: ?Last Dose: ? ?Activity level:   Can go up a flight of stairs and perform activities of daily living without stopping and without symptoms of chest pain or shortness of breath.  Able to exercise without symptoms ? ?Anesthesia review: BP elevated at PAT 167/91 and 158/102 on recheck.  Patient denies headache, chest pain or SOB.  He monitors daily at home and gets readings in the 130/80 range.  He will continue to monitor and notify PCP if he notices elevations. ? ?Patient denies shortness of breath, fever, cough and chest pain at PAT appointment ? ? ?Patient verbalized understanding of instructions that were given to them at the PAT appointment. Patient was also instructed that they will need to review over the PAT instructions again at home before surgery.  ?

## 2022-02-08 ENCOUNTER — Other Ambulatory Visit: Payer: Self-pay

## 2022-02-08 ENCOUNTER — Encounter (HOSPITAL_COMMUNITY)
Admission: RE | Admit: 2022-02-08 | Discharge: 2022-02-08 | Disposition: A | Discharge status: Home / Self Care | Payer: Medicare Other | Source: Ambulatory Visit | Attending: General Surgery | Admitting: General Surgery

## 2022-02-08 ENCOUNTER — Encounter (HOSPITAL_COMMUNITY): Payer: Self-pay

## 2022-02-08 VITALS — BP 158/102 | HR 57 | Temp 98.3°F | Resp 18 | Ht 70.5 in | Wt 171.4 lb

## 2022-02-08 DIAGNOSIS — Z01818 Encounter for other preprocedural examination: Secondary | ICD-10-CM | POA: Insufficient documentation

## 2022-02-08 DIAGNOSIS — Z1389 Encounter for screening for other disorder: Secondary | ICD-10-CM | POA: Insufficient documentation

## 2022-02-08 DIAGNOSIS — I2510 Atherosclerotic heart disease of native coronary artery without angina pectoris: Secondary | ICD-10-CM | POA: Diagnosis not present

## 2022-02-08 HISTORY — DX: Unspecified malignant neoplasm of skin, unspecified: C44.90

## 2022-02-08 LAB — CBC
HCT: 45.3 % (ref 39.0–52.0)
Hemoglobin: 15.6 g/dL (ref 13.0–17.0)
MCH: 32.8 pg (ref 26.0–34.0)
MCHC: 34.4 g/dL (ref 30.0–36.0)
MCV: 95.4 fL (ref 80.0–100.0)
Platelets: 242 10*3/uL (ref 150–400)
RBC: 4.75 MIL/uL (ref 4.22–5.81)
RDW: 13.1 % (ref 11.5–15.5)
WBC: 9.2 10*3/uL (ref 4.0–10.5)
nRBC: 0 % (ref 0.0–0.2)

## 2022-02-08 LAB — BASIC METABOLIC PANEL
Anion gap: 8 (ref 5–15)
BUN: 11 mg/dL (ref 8–23)
CO2: 27 mmol/L (ref 22–32)
Calcium: 9.5 mg/dL (ref 8.9–10.3)
Chloride: 101 mmol/L (ref 98–111)
Creatinine, Ser: 0.7 mg/dL (ref 0.61–1.24)
GFR, Estimated: >60 mL/min (ref >60)
Glucose, Bld: 120 mg/dL — ABNORMAL HIGH (ref 70–99)
Potassium: 3.7 mmol/L (ref 3.5–5.1)
Sodium: 136 mmol/L (ref 135–145)

## 2022-02-09 LAB — URINE CULTURE: Culture: NO GROWTH

## 2022-02-14 ENCOUNTER — Ambulatory Visit (INDEPENDENT_AMBULATORY_CARE_PROVIDER_SITE_OTHER): Payer: Medicare Other | Admitting: Family Medicine

## 2022-02-14 ENCOUNTER — Other Ambulatory Visit: Payer: Self-pay

## 2022-02-14 ENCOUNTER — Encounter: Payer: Self-pay | Admitting: Family Medicine

## 2022-02-14 VITALS — BP 162/86 | HR 63 | Temp 97.5°F | Ht 70.0 in | Wt 167.0 lb

## 2022-02-14 DIAGNOSIS — Z659 Problem related to unspecified psychosocial circumstances: Secondary | ICD-10-CM

## 2022-02-14 DIAGNOSIS — I10 Essential (primary) hypertension: Secondary | ICD-10-CM

## 2022-02-14 DIAGNOSIS — R3914 Feeling of incomplete bladder emptying: Secondary | ICD-10-CM

## 2022-02-14 DIAGNOSIS — N401 Enlarged prostate with lower urinary tract symptoms: Secondary | ICD-10-CM | POA: Diagnosis not present

## 2022-02-14 DIAGNOSIS — K4091 Unilateral inguinal hernia, without obstruction or gangrene, recurrent: Secondary | ICD-10-CM

## 2022-02-14 DIAGNOSIS — E782 Mixed hyperlipidemia: Secondary | ICD-10-CM | POA: Diagnosis not present

## 2022-02-14 MED ORDER — AMLODIPINE BESYLATE 10 MG PO TABS: 10.0000 mg | ORAL_TABLET | Freq: Every day | ORAL | 1 refills | Status: DC

## 2022-02-14 NOTE — Progress Notes (Signed)
Established Patient Office Visit  Subjective:  Patient ID: Collin Garza, male    DOB: 10/12/1951  Age: 71 y.o. MRN: [id number]  CC:  Chief Complaint  Patient presents with   Follow-up    3 month follow up, no concerns. Patient fasting.     HPI Collin Garza presents for follow-up of blood pressure and elevated cholesterol.  No issues with current medications.  He is fasting.  Robotic hernia surgery is scheduled for next month.  He has an indwelling bladder catheter for dystonia of the bladder secondary to BPH.  MRI of prostate is pending still.  Things are at a crisis point at home.  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  He has been violent.  He has multiple legal charges.  He is refusing help.  Past Medical History:  Diagnosis Date   Arthritis    LEFT HIP AND SHOULDER   Depression    Hypertension    Skin cancer     Past Surgical History:  Procedure Laterality Date   COLONOSCOPY     INGUINAL HERNIA REPAIR     x3   SKIN CANCER EXCISION      Family History  Problem Relation Age of Onset   Breast cancer Maternal Aunt    Diabetes Maternal Uncle    Heart disease Maternal Uncle    Breast cancer Maternal Grandmother    Pancreatic cancer Maternal Grandmother    Cancer Maternal Grandmother    Prostate cancer Cousin    Stomach cancer Paternal Aunt    Depression Mother    Early death Mother    Heart attack Mother    Cancer Father    COPD Father    Hearing loss Father    Arthritis Brother    Alcohol abuse Daughter    Depression Daughter    Diabetes Daughter    COPD Paternal Grandmother    Hearing loss Brother    Heart attack Brother    Alcohol abuse Brother    Asthma Brother    COPD Brother    Depression Brother    Colon cancer Neg Hx    Celiac disease Neg Hx    Cirrhosis Neg Hx    Clotting disorder Neg Hx    Colitis Neg Hx    Colon polyps Neg Hx    Crohn's disease Neg Hx     Cystic fibrosis Neg Hx    Esophageal cancer Neg Hx    Hemochromatosis Neg Hx    Inflammatory bowel disease Neg Hx    Irritable bowel syndrome Neg Hx    Kidney disease Neg Hx    Liver cancer Neg Hx    Liver disease Neg Hx    Ovarian cancer Neg Hx    Rectal cancer Neg Hx    Ulcerative colitis Neg Hx    Uterine cancer Neg Hx    Wilson's disease Neg Hx     Social History   Socioeconomic History   Marital status: Married    Spouse name: Not on file   Number of children: Not on file   Years of education: Not on file   Highest education level: Not on file  Occupational History   Not on file  Tobacco Use   Smoking status: Former    Packs/day: 0.25    Years: 3.00    Pack years: 0.75    Types: Cigarettes   Smokeless tobacco: Never  Tobacco comments:    quit when i was in the service  Vaping Use   Vaping Use: Never used  Substance and Sexual Activity   Alcohol use: Yes    Alcohol/week: 2.0 - 3.0 standard drinks    Types: 2 - 3 Cans of beer per week   Drug use: No   Sexual activity: Yes  Other Topics Concern   Not on file  Social History Narrative   Not on file   Social Determinants of Health   Financial Resource Strain: Not on file  Food Insecurity: Not on file  Transportation Needs: Not on file  Physical Activity: Not on file  Stress: Not on file  Social Connections: Not on file  Intimate Partner Violence: Not on file    Outpatient Medications Prior to Visit  Medication Sig Dispense Refill   atorvastatin (LIPITOR) 20 MG tablet TAKE 1 TABLET(20 MG) BY MOUTH DAILY (Patient taking differently: TAKES IN THE EVENING) 90 tablet 1   calcium-vitamin D (OSCAL 500/200 D-3) 500-200 MG-UNIT tablet Take 1 tablet by mouth 2 (two) times daily. (Patient taking differently: Take 2 tablets by mouth 2 (two) times daily.) 180 tablet 5   cholecalciferol (VITAMIN D) 25 MCG (1000 UNIT) tablet Take 1,000 Units by mouth daily.     Omega-3 Fatty Acids (OMEGA 3 PO) Take 2 tablets by  mouth daily. Dr. Hulda Marin     OVER THE COUNTER MEDICATION Take 2 tablets by mouth daily. Sinatra Prostate solution     sildenafil (VIAGRA) 100 MG tablet Take 50 mg by mouth daily as needed for erectile dysfunction.     vitamin C (ASCORBIC ACID) 500 MG tablet Take 500-1,000 mg by mouth daily.     amLODipine (NORVASC) 5 MG tablet TAKE 1 TABLET BY MOUTH DAILY (Patient taking differently: TAKE 1 TABLET BY MOUTH DAILY IN THE EVENING) 90 tablet 0   COVID-19 mRNA bivalent vaccine, Pfizer, (PFIZER COVID-19 VAC BIVALENT) injection Inject into the muscle. 0.3 mL 0   No facility-administered medications prior to visit.    Allergies  Allergen Reactions   Lisinopril Cough   Other Nausea Only    Other reaction(s): NAUSEA, VOMITING   Propoxyphene N-Acetaminophen Nausea Only   Penicillins Hives and Rash    ROS Review of Systems  Constitutional:  Negative for chills, diaphoresis, fatigue, fever and unexpected weight change.  HENT: Negative.    Eyes:  Negative for photophobia and visual disturbance.  Respiratory: Negative.    Cardiovascular: Negative.   Gastrointestinal:  Positive for abdominal pain.  Endocrine: Negative for polyphagia and polyuria.  Genitourinary:  Positive for difficulty urinating.  Musculoskeletal:  Negative for gait problem and joint swelling.  Neurological:  Negative for speech difficulty, weakness and numbness.  Psychiatric/Behavioral:  The patient is nervous/anxious.      Objective:    Physical Exam Vitals and nursing note reviewed.  Constitutional:      General: He is not in acute distress.    Appearance: Normal appearance. He is not ill-appearing, toxic-appearing or diaphoretic.  HENT:     Head: Normocephalic and atraumatic.  Eyes:     General: No scleral icterus.       Right eye: No discharge.        Left eye: No discharge.     Extraocular Movements: Extraocular movements intact.     Conjunctiva/sclera: Conjunctivae normal.  Cardiovascular:     Rate and  Rhythm: Normal rate and regular rhythm.  Pulmonary:     Effort: Pulmonary effort is normal.  Breath sounds: Normal breath sounds.  Skin:    General: Skin is warm.  Neurological:     Mental Status: He is alert and oriented to person, place, and time.  Psychiatric:        Mood and Affect: Mood normal.        Behavior: Behavior normal.    BP (!) 162/86 (BP Location: Left Arm, Patient Position: Sitting, Cuff Size: Normal)   Pulse 63   Temp (!) 97.5 F (36.4 C) (Temporal)   Ht 5\' 10"  (1.778 m)   Wt 167 lb (75.8 kg)   SpO2 97%   BMI 23.96 kg/m  Wt Readings from Last 3 Encounters:  02/14/22 167 lb (75.8 kg)  02/08/22 171 lb 6.4 oz (77.7 kg)  11/16/21 173 lb 3.2 oz (78.6 kg)     There are no preventive care reminders to display for this patient.  There are no preventive care reminders to display for this patient.  Lab Results  Component Value Date   TSH 0.81 12/12/2017   Lab Results  Component Value Date   WBC 9.2 02/08/2022   HGB 15.6 02/08/2022   HCT 45.3 02/08/2022   MCV 95.4 02/08/2022   PLT 242 02/08/2022   Lab Results  Component Value Date   NA 136 02/08/2022   K 3.7 02/08/2022   CO2 27 02/08/2022   GLUCOSE 120 (H) 02/08/2022   BUN 11 02/08/2022   CREATININE 0.70 02/08/2022   BILITOT 1.3 (H) 11/16/2021   ALKPHOS 59 11/16/2021   AST 22 11/16/2021   ALT 19 11/16/2021   PROT 7.3 11/16/2021   ALBUMIN 4.7 11/16/2021   CALCIUM 9.5 02/08/2022   ANIONGAP 8 02/08/2022   GFR 92.92 11/16/2021   Lab Results  Component Value Date   CHOL 190 11/16/2021   Lab Results  Component Value Date   HDL 87.60 11/16/2021   Lab Results  Component Value Date   LDLCALC 73 11/16/2021   Lab Results  Component Value Date   TRIG 148.0 11/16/2021   Lab Results  Component Value Date   CHOLHDL 2 11/16/2021   Lab Results  Component Value Date   HGBA1C 5.3 06/17/2018   The 10-year ASCVD risk score (Arnett DK, et al., 2019) is: 19.9%   Values used to calculate the  score:     Age: 71 years     Sex: Male     Is Non-Hispanic African American: No     Diabetic: No     Tobacco smoker: No     Systolic Blood Pressure: 162 mmHg     Is BP treated: Yes     HDL Cholesterol: 87.6 mg/dL     Total Cholesterol: 190 mg/dL    Assessment & Plan:   Problem List Items Addressed This Visit       Cardiovascular and Mediastinum   Essential hypertension   Relevant Medications   amLODipine (NORVASC) 10 MG tablet   Other Relevant Orders   Comprehensive metabolic panel     Genitourinary   Benign prostatic hyperplasia with incomplete bladder emptying     Other   HYPERLIPIDEMIA - Primary   Relevant Medications   amLODipine (NORVASC) 10 MG tablet   Other Relevant Orders   Comprehensive metabolic panel   Lipid panel   Unilateral recurrent inguinal hernia without obstruction or gangrene   Other social stressor    Meds ordered this encounter  Medications   amLODipine (NORVASC) 10 MG tablet    Sig: Take 1 tablet (10 mg  total) by mouth daily.    Dispense:  90 tablet    Refill:  1    Follow-up: Return in about 3 months (around 05/17/2022).  Wished him luck with the upcoming inguinal hernia surgery and MRI prostate.  Information was given on mindfulness based stress reduction.  Have increased amlodipine to 10 mg.  He will check and record blood pressures.  Information was given on managing hypertension.  Mliss Sax, MD

## 2022-02-15 LAB — COMPREHENSIVE METABOLIC PANEL
ALT: 7 U/L (ref 0–53)
AST: 16 U/L (ref 0–37)
Albumin: 4.5 g/dL (ref 3.5–5.2)
Alkaline Phosphatase: 55 U/L (ref 39–117)
BUN: 11 mg/dL (ref 6–23)
CO2: 29 mEq/L (ref 19–32)
Calcium: 9.6 mg/dL (ref 8.4–10.5)
Chloride: 101 mEq/L (ref 96–112)
Creatinine, Ser: 0.82 mg/dL (ref 0.40–1.50)
GFR: 90.29 mL/min (ref >60.00)
Glucose, Bld: 109 mg/dL — ABNORMAL HIGH (ref 70–99)
Potassium: 4.4 mEq/L (ref 3.5–5.1)
Sodium: 138 mEq/L (ref 135–145)
Total Bilirubin: 0.6 mg/dL (ref 0.2–1.2)
Total Protein: 6.4 g/dL (ref 6.0–8.3)

## 2022-02-15 LAB — LIPID PANEL
Cholesterol: 153 mg/dL (ref 0–200)
HDL: 60.1 mg/dL (ref >39.00)
LDL Cholesterol: 77 mg/dL (ref 0–99)
NonHDL: 92.69
Total CHOL/HDL Ratio: 3
Triglycerides: 78 mg/dL (ref 0.0–149.0)
VLDL: 15.6 mg/dL (ref 0.0–40.0)

## 2022-02-16 ENCOUNTER — Encounter: Payer: Self-pay | Admitting: Family Medicine

## 2022-02-23 ENCOUNTER — Ambulatory Visit (HOSPITAL_COMMUNITY)
Admission: RE | Admit: 2022-02-23 | Discharge: 2022-02-23 | Disposition: A | Discharge status: Home / Self Care | Payer: Medicare Other | Source: Ambulatory Visit | Attending: General Surgery | Admitting: General Surgery

## 2022-02-23 ENCOUNTER — Ambulatory Visit (HOSPITAL_BASED_OUTPATIENT_CLINIC_OR_DEPARTMENT_OTHER): Payer: Medicare Other | Admitting: Certified Registered Nurse Anesthetist

## 2022-02-23 ENCOUNTER — Encounter (HOSPITAL_COMMUNITY)
Admission: RE | Disposition: A | Discharge status: Home / Self Care | Payer: Self-pay | Source: Ambulatory Visit | Attending: General Surgery

## 2022-02-23 ENCOUNTER — Other Ambulatory Visit: Payer: Self-pay

## 2022-02-23 ENCOUNTER — Ambulatory Visit (HOSPITAL_COMMUNITY): Payer: Medicare Other | Admitting: Physician Assistant

## 2022-02-23 ENCOUNTER — Encounter (HOSPITAL_COMMUNITY): Payer: Self-pay | Admitting: General Surgery

## 2022-02-23 DIAGNOSIS — H6123 Impacted cerumen, bilateral: Secondary | ICD-10-CM

## 2022-02-23 DIAGNOSIS — I10 Essential (primary) hypertension: Secondary | ICD-10-CM | POA: Insufficient documentation

## 2022-02-23 DIAGNOSIS — N322 Vesical fistula, not elsewhere classified: Secondary | ICD-10-CM | POA: Diagnosis not present

## 2022-02-23 DIAGNOSIS — K4031 Unilateral inguinal hernia, with obstruction, without gangrene, recurrent: Secondary | ICD-10-CM | POA: Insufficient documentation

## 2022-02-23 DIAGNOSIS — R972 Elevated prostate specific antigen [PSA]: Secondary | ICD-10-CM | POA: Diagnosis not present

## 2022-02-23 DIAGNOSIS — Z79899 Other long term (current) drug therapy: Secondary | ICD-10-CM | POA: Insufficient documentation

## 2022-02-23 DIAGNOSIS — N3289 Other specified disorders of bladder: Secondary | ICD-10-CM | POA: Diagnosis not present

## 2022-02-23 DIAGNOSIS — N323 Diverticulum of bladder: Secondary | ICD-10-CM | POA: Insufficient documentation

## 2022-02-23 DIAGNOSIS — Z87891 Personal history of nicotine dependence: Secondary | ICD-10-CM | POA: Diagnosis not present

## 2022-02-23 DIAGNOSIS — M1990 Unspecified osteoarthritis, unspecified site: Secondary | ICD-10-CM | POA: Diagnosis not present

## 2022-02-23 DIAGNOSIS — K4091 Unilateral inguinal hernia, without obstruction or gangrene, recurrent: Secondary | ICD-10-CM

## 2022-02-23 DIAGNOSIS — N401 Enlarged prostate with lower urinary tract symptoms: Secondary | ICD-10-CM

## 2022-02-23 HISTORY — PX: ROBOTIC ASSISTED LAPAROSCOPIC BLADDER DIVERTICULECTOMY: SHX6079

## 2022-02-23 SURGERY — HERNIORRHAPHY, INGUINAL, ROBOT-ASSISTED, LAPAROSCOPIC
Anesthesia: General

## 2022-02-23 MED ORDER — AMISULPRIDE (ANTIEMETIC) 5 MG/2ML IV SOLN
INTRAVENOUS | Status: AC
  Filled 2022-02-23: qty 4

## 2022-02-23 MED ORDER — CIPROFLOXACIN IN D5W 400 MG/200ML IV SOLN
400.0000 mg | INTRAVENOUS | Status: AC
  Administered 2022-02-23: 400 mg via INTRAVENOUS
  Filled 2022-02-23: qty 200

## 2022-02-23 MED ORDER — SUGAMMADEX SODIUM 200 MG/2ML IV SOLN
INTRAVENOUS | Status: DC | PRN
Start: 2022-02-23 — End: 2022-02-23
  Administered 2022-02-23: 200 mg via INTRAVENOUS

## 2022-02-23 MED ORDER — OXYCODONE HCL 5 MG PO TABS: 5.0000 mg | ORAL_TABLET | Freq: Once | ORAL | Status: AC | PRN

## 2022-02-23 MED ORDER — CHLORHEXIDINE GLUCONATE CLOTH 2 % EX PADS: 6.0000 | MEDICATED_PAD | Freq: Once | CUTANEOUS | Status: DC

## 2022-02-23 MED ORDER — BUPIVACAINE LIPOSOME 1.3 % IJ SUSP
INTRAMUSCULAR | Status: DC | PRN
  Administered 2022-02-23: 20 mL

## 2022-02-23 MED ORDER — LIDOCAINE HCL (PF) 2 % IJ SOLN
INTRAMUSCULAR | Status: AC
  Filled 2022-02-23: qty 5

## 2022-02-23 MED ORDER — ACETAMINOPHEN 10 MG/ML IV SOLN: 1000.0000 mg | Freq: Once | INTRAVENOUS | Status: DC | PRN

## 2022-02-23 MED ORDER — ACETAMINOPHEN 325 MG PO TABS: 325.0000 mg | ORAL_TABLET | ORAL | Status: DC | PRN

## 2022-02-23 MED ORDER — LACTATED RINGERS IV SOLN: INTRAVENOUS | Status: DC | PRN

## 2022-02-23 MED ORDER — ORAL CARE MOUTH RINSE: 15.0000 mL | Freq: Once | OROMUCOSAL | Status: AC

## 2022-02-23 MED ORDER — KETOROLAC TROMETHAMINE 15 MG/ML IJ SOLN
15.0000 mg | INTRAMUSCULAR | Status: AC
  Administered 2022-02-23: 15 mg via INTRAVENOUS

## 2022-02-23 MED ORDER — OXYCODONE HCL 5 MG/5ML PO SOLN: 5.0000 mg | Freq: Once | ORAL | Status: AC | PRN

## 2022-02-23 MED ORDER — KETAMINE HCL 10 MG/ML IJ SOLN
INTRAMUSCULAR | Status: DC | PRN
  Administered 2022-02-23: 50 mg via INTRAVENOUS

## 2022-02-23 MED ORDER — FENTANYL CITRATE (PF) 250 MCG/5ML IJ SOLN
INTRAMUSCULAR | Status: AC
  Filled 2022-02-23: qty 5

## 2022-02-23 MED ORDER — KETOROLAC TROMETHAMINE 30 MG/ML IJ SOLN
INTRAMUSCULAR | Status: AC
  Filled 2022-02-23: qty 1

## 2022-02-23 MED ORDER — ACETAMINOPHEN 160 MG/5ML PO SOLN: 325.0000 mg | ORAL | Status: DC | PRN

## 2022-02-23 MED ORDER — MIDAZOLAM HCL 5 MG/5ML IJ SOLN
INTRAMUSCULAR | Status: DC | PRN
  Administered 2022-02-23: 2 mg via INTRAVENOUS

## 2022-02-23 MED ORDER — BUPIVACAINE LIPOSOME 1.3 % IJ SUSP
INTRAMUSCULAR | Status: AC
  Filled 2022-02-23: qty 20

## 2022-02-23 MED ORDER — CHLORHEXIDINE GLUCONATE 0.12 % MT SOLN
15.0000 mL | Freq: Once | OROMUCOSAL | Status: AC
  Administered 2022-02-23: 15 mL via OROMUCOSAL

## 2022-02-23 MED ORDER — PROPOFOL 10 MG/ML IV BOLUS
INTRAVENOUS | Status: DC | PRN
  Administered 2022-02-23: 150 mg via INTRAVENOUS

## 2022-02-23 MED ORDER — OXYCODONE HCL 5 MG PO TABS
ORAL_TABLET | ORAL | Status: AC
  Administered 2022-02-23: 5 mg via ORAL
  Filled 2022-02-23: qty 1

## 2022-02-23 MED ORDER — ACETAMINOPHEN 10 MG/ML IV SOLN
INTRAVENOUS | Status: AC
  Filled 2022-02-23: qty 100

## 2022-02-23 MED ORDER — FENTANYL CITRATE (PF) 250 MCG/5ML IJ SOLN
INTRAMUSCULAR | Status: DC | PRN
  Administered 2022-02-23 (×3): 50 ug via INTRAVENOUS
  Administered 2022-02-23: 100 ug via INTRAVENOUS

## 2022-02-23 MED ORDER — LIDOCAINE 2% (20 MG/ML) 5 ML SYRINGE
INTRAMUSCULAR | Status: DC | PRN
  Administered 2022-02-23: 40 mg via INTRAVENOUS

## 2022-02-23 MED ORDER — PROMETHAZINE HCL 25 MG/ML IJ SOLN
INTRAMUSCULAR | Status: AC
  Filled 2022-02-23: qty 1

## 2022-02-23 MED ORDER — LIDOCAINE HCL (PF) 2 % IJ SOLN
INTRAMUSCULAR | Status: AC
  Filled 2022-02-23: qty 15

## 2022-02-23 MED ORDER — PHENYLEPHRINE HCL-NACL 20-0.9 MG/250ML-% IV SOLN
INTRAVENOUS | Status: DC | PRN
  Administered 2022-02-23: 35 ug/min via INTRAVENOUS

## 2022-02-23 MED ORDER — STERILE WATER FOR IRRIGATION IR SOLN
Status: DC | PRN
  Administered 2022-02-23: 1000 mL

## 2022-02-23 MED ORDER — FENTANYL CITRATE PF 50 MCG/ML IJ SOSY
25.0000 ug | PREFILLED_SYRINGE | INTRAMUSCULAR | Status: DC | PRN
  Administered 2022-02-23: 25 ug via INTRAVENOUS

## 2022-02-23 MED ORDER — ONDANSETRON HCL 4 MG/2ML IJ SOLN
INTRAMUSCULAR | Status: DC | PRN
  Administered 2022-02-23: 4 mg via INTRAVENOUS

## 2022-02-23 MED ORDER — DEXAMETHASONE SODIUM PHOSPHATE 10 MG/ML IJ SOLN
INTRAMUSCULAR | Status: DC | PRN
  Administered 2022-02-23: 10 mg via INTRAVENOUS

## 2022-02-23 MED ORDER — ROCURONIUM BROMIDE 10 MG/ML (PF) SYRINGE
PREFILLED_SYRINGE | INTRAVENOUS | Status: AC
  Filled 2022-02-23: qty 10

## 2022-02-23 MED ORDER — LACTATED RINGERS IR SOLN
Status: DC | PRN
  Administered 2022-02-23: 1000 mL

## 2022-02-23 MED ORDER — SODIUM CHLORIDE (PF) 0.9 % IJ SOLN
INTRAMUSCULAR | Status: AC
  Filled 2022-02-23: qty 20

## 2022-02-23 MED ORDER — DEXAMETHASONE SODIUM PHOSPHATE 10 MG/ML IJ SOLN
INTRAMUSCULAR | Status: AC
  Filled 2022-02-23: qty 1

## 2022-02-23 MED ORDER — FENTANYL CITRATE PF 50 MCG/ML IJ SOSY
PREFILLED_SYRINGE | INTRAMUSCULAR | Status: AC
  Administered 2022-02-23: 25 ug
  Filled 2022-02-23: qty 3

## 2022-02-23 MED ORDER — STERILE WATER FOR IRRIGATION IR SOLN
Status: DC | PRN
  Administered 2022-02-23: 3000 mL via INTRAVESICAL

## 2022-02-23 MED ORDER — BUPIVACAINE-EPINEPHRINE (PF) 0.25% -1:200000 IJ SOLN
INTRAMUSCULAR | Status: AC
  Filled 2022-02-23: qty 30

## 2022-02-23 MED ORDER — ONDANSETRON HCL 4 MG/2ML IJ SOLN
INTRAMUSCULAR | Status: AC
  Filled 2022-02-23: qty 2

## 2022-02-23 MED ORDER — TRAMADOL HCL 50 MG PO TABS: 50.0000 mg | ORAL_TABLET | Freq: Four times a day (QID) | ORAL | 0 refills | Status: DC | PRN

## 2022-02-23 MED ORDER — GLYCOPYRROLATE 0.2 MG/ML IJ SOLN
INTRAMUSCULAR | Status: AC
  Filled 2022-02-23: qty 1

## 2022-02-23 MED ORDER — ENSURE PRE-SURGERY PO LIQD
296.0000 mL | Freq: Once | ORAL | Status: DC
  Filled 2022-02-23: qty 296

## 2022-02-23 MED ORDER — BUPIVACAINE-EPINEPHRINE (PF) 0.5% -1:200000 IJ SOLN
INTRAMUSCULAR | Status: DC | PRN
  Administered 2022-02-23: 28 mL

## 2022-02-23 MED ORDER — KETAMINE HCL 50 MG/5ML IJ SOSY
PREFILLED_SYRINGE | INTRAMUSCULAR | Status: AC
  Filled 2022-02-23: qty 5

## 2022-02-23 MED ORDER — SODIUM CHLORIDE 0.9 % IR SOLN
Status: DC | PRN
  Administered 2022-02-23: 3000 mL via INTRAVESICAL

## 2022-02-23 MED ORDER — ROCURONIUM BROMIDE 10 MG/ML (PF) SYRINGE
PREFILLED_SYRINGE | INTRAVENOUS | Status: DC | PRN
  Administered 2022-02-23: 40 mg via INTRAVENOUS
  Administered 2022-02-23: 30 mg via INTRAVENOUS
  Administered 2022-02-23: 60 mg via INTRAVENOUS

## 2022-02-23 MED ORDER — LACTATED RINGERS IV SOLN: INTRAVENOUS | Status: DC

## 2022-02-23 MED ORDER — PROMETHAZINE HCL 25 MG/ML IJ SOLN
6.2500 mg | Freq: Once | INTRAMUSCULAR | Status: AC
  Administered 2022-02-23: 6.25 mg via INTRAVENOUS

## 2022-02-23 MED ORDER — MIDAZOLAM HCL 2 MG/2ML IJ SOLN
INTRAMUSCULAR | Status: AC
  Filled 2022-02-23: qty 2

## 2022-02-23 MED ORDER — VANCOMYCIN HCL IN DEXTROSE 1-5 GM/200ML-% IV SOLN
1000.0000 mg | INTRAVENOUS | Status: AC
  Administered 2022-02-23: 1000 mg via INTRAVENOUS
  Filled 2022-02-23: qty 200

## 2022-02-23 MED ORDER — AMISULPRIDE (ANTIEMETIC) 5 MG/2ML IV SOLN
10.0000 mg | Freq: Once | INTRAVENOUS | Status: AC | PRN
  Administered 2022-02-23: 10 mg via INTRAVENOUS

## 2022-02-23 MED ORDER — ACETAMINOPHEN 10 MG/ML IV SOLN
1000.0000 mg | Freq: Once | INTRAVENOUS | Status: AC
  Administered 2022-02-23: 1000 mg via INTRAVENOUS

## 2022-02-23 MED ORDER — LIDOCAINE 2% (20 MG/ML) 5 ML SYRINGE
INTRAMUSCULAR | Status: DC | PRN
  Administered 2022-02-23: 1.5 mg/kg/h via INTRAVENOUS

## 2022-02-23 MED ORDER — PROPOFOL 10 MG/ML IV BOLUS
INTRAVENOUS | Status: AC
  Filled 2022-02-23: qty 20

## 2022-02-23 MED ORDER — SODIUM CHLORIDE 0.9 % IV SOLN: 6.2500 mg | Freq: Once | INTRAVENOUS | Status: DC

## 2022-02-23 MED ORDER — BUPIVACAINE-EPINEPHRINE (PF) 0.5% -1:200000 IJ SOLN
INTRAMUSCULAR | Status: AC
  Filled 2022-02-23: qty 30

## 2022-02-23 SURGICAL SUPPLY — 90 items
ADH SKN CLS APL DERMABOND .7 (GAUZE/BANDAGES/DRESSINGS) ×2
AGENT HMST KT MTR STRL THRMB (HEMOSTASIS)
APL ESCP 34 STRL LF DISP (HEMOSTASIS)
APL PRP STRL LF DISP 70% ISPRP (MISCELLANEOUS) ×2
APL SWBSTK 6 STRL LF DISP (MISCELLANEOUS) ×2
APPLICATOR COTTON TIP 6 STRL (MISCELLANEOUS) ×3 IMPLANT
APPLICATOR COTTON TIP 6IN STRL (MISCELLANEOUS) ×3
APPLICATOR SURGIFLO ENDO (HEMOSTASIS) ×3 IMPLANT
BAG COUNTER SPONGE SURGICOUNT (BAG) IMPLANT
BAG LAPAROSCOPIC 12 15 PORT 16 (BASKET) ×3 IMPLANT
BAG RETRIEVAL 10 (BASKET)
BAG RETRIEVAL 12/15 (BASKET)
BAG SPNG CNTER NS LX DISP (BAG)
BAG URO CATCHER STRL LF (MISCELLANEOUS) ×4 IMPLANT
BLADE SURG SZ10 CARB STEEL (BLADE) IMPLANT
CATH 18FR 3 WAY 30 (CATHETERS) ×3
CATH 18FR 3WAY 30CC (CATHETERS) IMPLANT
CATH FOLEY 2WAY SLVR  5CC 16FR (CATHETERS)
CATH FOLEY 2WAY SLVR 5CC 16FR (CATHETERS) ×3 IMPLANT
CHLORAPREP W/TINT 26 (MISCELLANEOUS) ×4 IMPLANT
CLIP LIGATING HEM O LOK PURPLE (MISCELLANEOUS) ×9 IMPLANT
CLIP LIGATING HEMO LOK XL GOLD (MISCELLANEOUS) IMPLANT
COVER SURGICAL LIGHT HANDLE (MISCELLANEOUS) ×4 IMPLANT
COVER TIP SHEARS 8 DVNC (MISCELLANEOUS) ×3 IMPLANT
COVER TIP SHEARS 8MM DA VINCI (MISCELLANEOUS) ×3
DERMABOND ADVANCED (GAUZE/BANDAGES/DRESSINGS) ×1
DERMABOND ADVANCED .7 DNX12 (GAUZE/BANDAGES/DRESSINGS) ×6 IMPLANT
DEVICE TROCAR PUNCTURE CLOSURE (ENDOMECHANICALS) ×1 IMPLANT
DRAIN CHANNEL RND F F (WOUND CARE) IMPLANT
DRAPE ARM DVNC X/XI (DISPOSABLE) ×12 IMPLANT
DRAPE COLUMN DVNC XI (DISPOSABLE) ×3 IMPLANT
DRAPE DA VINCI XI ARM (DISPOSABLE) ×12
DRAPE DA VINCI XI COLUMN (DISPOSABLE) ×3
ELECT PENCIL ROCKER SW 15FT (MISCELLANEOUS) ×1 IMPLANT
ELECT REM PT RETURN 15FT ADLT (MISCELLANEOUS) ×4 IMPLANT
GAUZE 4X4 16PLY ~~LOC~~+RFID DBL (SPONGE) ×1 IMPLANT
GLOVE SURG ENC MOIS LTX SZ6.5 (GLOVE) ×4 IMPLANT
GLOVE SURG ENC TEXT LTX SZ7.5 (GLOVE) ×12 IMPLANT
HOLDER FOLEY CATH W/STRAP (MISCELLANEOUS) ×4 IMPLANT
IRRIG SUCT STRYKERFLOW 2 WTIP (MISCELLANEOUS) ×3
IRRIGATION SUCT STRKRFLW 2 WTP (MISCELLANEOUS) ×3 IMPLANT
KIT TURNOVER KIT A (KITS) IMPLANT
LOOP VESSEL MAXI BLUE (MISCELLANEOUS) ×4 IMPLANT
LOOP VESSEL MINI RED (MISCELLANEOUS) ×3 IMPLANT
MESH PROGRIP LAP SELF FIXATING (Mesh General) ×3 IMPLANT
MESH PROGRIP LAP SLF FIX 16X12 (Mesh General) IMPLANT
PACK ROBOT UROLOGY CUSTOM (CUSTOM PROCEDURE TRAY) ×4 IMPLANT
PROTECTOR NERVE ULNAR (MISCELLANEOUS) ×7 IMPLANT
RELOAD LINEAR CUT PROX 55 BLUE (ENDOMECHANICALS) IMPLANT
RELOAD STAPLE 55 3.8 BLU REG (ENDOMECHANICALS) IMPLANT
RELOAD STAPLE 60 4.1 GRN THCK (STAPLE) IMPLANT
RELOAD STAPLER GREEN 60MM (STAPLE) IMPLANT
SEAL CANN UNIV 5-8 DVNC XI (MISCELLANEOUS) ×12 IMPLANT
SEAL XI 5MM-8MM UNIVERSAL (MISCELLANEOUS) ×12
SET IRRIG Y TYPE TUR BLADDER L (SET/KITS/TRAYS/PACK) ×1 IMPLANT
SET TUBE SMOKE EVAC HIGH FLOW (TUBING) ×4 IMPLANT
SOLUTION ELECTROLUBE (MISCELLANEOUS) ×4 IMPLANT
SPIKE FLUID TRANSFER (MISCELLANEOUS) ×4 IMPLANT
SPONGE T-LAP 4X18 ~~LOC~~+RFID (SPONGE) ×4 IMPLANT
STAPLE ECHEON FLEX 60 POW ENDO (STAPLE) ×3 IMPLANT
STAPLER GUN LINEAR PROX 60 (STAPLE) IMPLANT
STAPLER PROXIMATE 55 BLUE (STAPLE) IMPLANT
STAPLER RELOAD GREEN 60MM (STAPLE)
SURGIFLO W/THROMBIN 8M KIT (HEMOSTASIS) ×3 IMPLANT
SUT ETHILON 3 0 PS 1 (SUTURE) ×3 IMPLANT
SUT MNCRL AB 4-0 PS2 18 (SUTURE) ×2 IMPLANT
SUT PDS AB 0 CTX 36 PDP370T (SUTURE) ×8 IMPLANT
SUT SILK 3 0 SH 30 (SUTURE) ×30 IMPLANT
SUT SILK 3 0 SH CR/8 (SUTURE) IMPLANT
SUT VIC AB 0 CT1 27 (SUTURE) ×3
SUT VIC AB 0 CT1 27XBRD ANTBC (SUTURE) IMPLANT
SUT VIC AB 2-0 SH 27 (SUTURE) ×9
SUT VIC AB 2-0 SH 27X BRD (SUTURE) IMPLANT
SUT VIC AB 2-0 UR5 27 (SUTURE) ×8 IMPLANT
SUT VIC AB 2-0 UR6 27 (SUTURE) ×4 IMPLANT
SUT VIC AB 3-0 SH 27 (SUTURE)
SUT VIC AB 3-0 SH 27X BRD (SUTURE) ×6 IMPLANT
SUT VIC AB 4-0 PS2 18 (SUTURE) ×8 IMPLANT
SUT VICRYL 0 UR6 27IN ABS (SUTURE) ×14 IMPLANT
SUT VLOC 180 2-0 9IN GS21 (SUTURE) ×1 IMPLANT
SUT VLOC BARB 180 ABS3/0GR12 (SUTURE) ×3
SUTURE VLOC BRB 180 ABS3/0GR12 (SUTURE) IMPLANT
SYS BAG RETRIEVAL 10MM (BASKET)
SYSTEM BAG RETRIEVAL 10MM (BASKET) IMPLANT
TOWEL OR NON WOVEN STRL DISP B (DISPOSABLE) ×4 IMPLANT
TRAY FOLEY MTR SLVR 16FR STAT (SET/KITS/TRAYS/PACK) ×3 IMPLANT
TROCAR XCEL NON-BLD 5MMX100MML (ENDOMECHANICALS) IMPLANT
URINEMETER 350 (MISCELLANEOUS) ×3 IMPLANT
VESSEL LOOPS MINI RED (MISCELLANEOUS)
WATER STERILE IRR 1000ML POUR (IV SOLUTION) ×7 IMPLANT

## 2022-02-23 NOTE — Op Note (Signed)
Preoperative diagnosis:  ?Large posterior bladder diverticulum ? ?Postoperative diagnosis:  ?same  ? ?Procedure: ?Robotic assisted laparoscopic partial cystectomy ?Cystoscopy ? ?Surgeon: Ardis Hughs, MD ?First Assistant: Debbrah Alar, PA ? ?An assistant was required for this surgical procedure.  The duties of the assistant included but were not limited to suctioning, passing suture, camera manipulation, retraction. This procedure would not be able to be performed without an Environmental consultant.  ? ?Anesthesia: General ? ?Complications: None ? ?Intraoperative findings:  ?#1: Cystoscopy demonstrated a large capacious bladder with trabeculations.  The ureteral orifice ease were orthotopic.  The os for the diverticulum was on the posterior right bladder wall.[name] ?#2: Diverticulum was dissected free and the os was approximately 1.5 cm that was reapproximated and noted to be watertight at the end of the case. ? ?EBL: Minimal ? ?Specimens:  ?#1.  Bladder diverticulum ? ?Indication: Collin Garza is a 71 y.o. patient with incidental finding of a large bladder diverticulum on CT scan performed for abdominal pain.  He was also noted to have a incarcerated left inguinal hernia after reviewing the management options for treatment, he elected to proceed with the resection of the bladder diverticulum and inguinal hernia repair. We have discussed the potential benefits and risks of the procedure, side effects of the proposed treatment, the likelihood of the patient achieving the goals of the procedure, and any potential problems that might occur during the procedure or recuperation. Informed consent has been obtained. ? ?Description: ? ?The patient was consented in the preoperative holding area. He was in brought back to the operating room placed the table in supine position. General anesthesia was then induced and endotracheal tube was inserted. He was then placed in dorsolithotomy position and placed in steep Trendelenburg. He was  then prepped and draped in the routine sterile fashion.  Using a 72 French flexible cystoscope was gently passed through the patient's urethra and into the bladder under visual guidance.  The above findings were noted.  There is no other significant or unusual findings.  I then remove the scope and placed an 18 French three-way Foley catheter. ? ? ?I then began by making a 10 mm incision supraumbilical midline incision the skin down through into the peritoneum. Then placed a 8 mm trocar. I then inflated the abdomen and inserted the 0? robotic lens. We then placed 2 additional a 8 millimeter trochars in the patient's left lower abdomen proximally 9 cm apart and 2 trochars on the patient's right lower abdomen, one was an 8 mm trocar and the one most lateral was a 12 mm trocar which was used as the assistant port. A 5 mm trocar was placed by triangulating the 2 right lateral ports as a second assistant port. These ports were all placed under visual guidance. Once the ports were noted to be satisfactory position the robot was docked. We started with the 0? lens, monopolar scissors in the right hand and the [place] forceps the left hand as well as a fenestrated grasper as the third arm on the left-hand side.  ?  ?The bladder was subsequently filled up in the diverticulum was readily apparent.  I incised the peritoneum to the right the patient's bladder and dissected down on top of the diverticulum.  I then dissected the diverticulum free from the surrounding tissues to the os at the bladder wall.  I then incised the diverticulum partially and placed a 2-0 Vicryl into the urothelium at the lateral edge of the incision prior to incising  the remainder of the diverticulum.  Once the diverticulum was free it was removed  through the assistant.  I subsequently approximated the bladder mucosa and then closed the bladder in 2 additional layers.  The oblique check was performed by filling the bladder up and noting no significant  leakage.  I then closed the posterior peritoneum.  I subsequently turned the case over to Dr. Rosendo Gros for the left inguinal hernia repair. ?

## 2022-02-23 NOTE — Transfer of Care (Signed)
Immediate Anesthesia Transfer of Care Note ? ?Patient: Mattthew Ziomek Labuda ? ?Procedure(s) Performed: ROBOTIC LEFT INGUINAL HERNIA REPAIR WITH MESH (Left) ?XI ROBOTIC ASSISTED LAPAROSCOPIC PARTIAL CYSTECTOMY, FLEXIBLE CYSTOSCOPY ? ?Patient Location: PACU ? ?Anesthesia Type:General ? ?Level of Consciousness: awake, alert  and oriented ? ?Airway & Oxygen Therapy: Patient Spontanous Breathing and Patient connected to face mask oxygen ? ?Post-op Assessment: Report given to RN and Post -op Vital signs reviewed and stable ? ?Post vital signs: Reviewed and stable ? ?Last Vitals:  ?Vitals Value Taken Time  ?BP 145/92 02/23/22 1124  ?Temp    ?Pulse 85 02/23/22 1126  ?Resp 25 02/23/22 1126  ?SpO2 100 % 02/23/22 1126  ?Vitals shown include unvalidated device data. ? ?Last Pain:  ?Vitals:  ? 02/23/22 0603  ?TempSrc: Oral  ?   ? ?  ? ?Complications: No notable events documented. ?

## 2022-02-23 NOTE — Anesthesia Procedure Notes (Signed)
Procedure Name: Intubation ?Date/Time: 02/23/2022 8:05 AM ?Performed by: Maxwell Caul, CRNA ?Pre-anesthesia Checklist: Patient identified, Emergency Drugs available, Suction available and Patient being monitored ?Patient Re-evaluated:Patient Re-evaluated prior to induction ?Oxygen Delivery Method: Circle system utilized ?Preoxygenation: Pre-oxygenation with 100% oxygen ?Induction Type: IV induction ?Ventilation: Mask ventilation without difficulty ?Laryngoscope Size: Mac and 4 ?Grade View: Grade III ?Tube type: Oral ?Tube size: 7.5 mm ?Number of attempts: 2 ?Airway Equipment and Method: Stylet ?Placement Confirmation: ETT inserted through vocal cords under direct vision, positive ETCO2 and breath sounds checked- equal and bilateral ?Secured at: 22 cm ?Tube secured with: Tape ?Dental Injury: Teeth and Oropharynx as per pre-operative assessment  ?Comments: DL x 1 with MAC 4 via CRNA-poor view, DL x 1 with MAC 4 via MDA-Grade 3-4 view, ETT 7.5 gently placed. ? ? ? ? ?

## 2022-02-23 NOTE — H&P (Signed)
71 year old male who I saw initially for elevated PSA. His PSA had been rising, and I recommended that we proceed with a prostate MRI followed by prostate biopsy. However, he subsequently was seen in the emergency department for abdominal pain. He had a CT scan that was done at that time that demonstrated a left inguinal hernia with a knuckle of bowel within the hernia. He also was noted to have a large bladder diverticulum in the left posterior wall.  ? ?The patient has opted to hold off on a prostate work-up including the MRI and prostate biopsy and instead wishes to treat the hernia and bladder diverticulum.  ? ?Fortunately, at this time the patient is not having any ongoing abdominal pain. He also has very few voiding symptoms. He has no sense of incomplete emptying or incontinence. He has no bladder pain. He denies any hematuria or dysuria.  ? ?  ?ALLERGIES: lisinopril ?penicillin ?Percocet ?  ? ?MEDICATIONS: Sildenafil Citrate 100 mg tablet 1/2 tablet PO Daily PRN  ?Amlodipine Besylate 5 mg tablet  ?Atorvastatin Calcium 20 mg tablet  ?Centrum Silver  ?Citracal  ?Co Q10  ?Multivitamin  ?Omega Q Plus  ?Os-Cal 500-Vit D3  ?Vitamin C  ?  ? ?GU PSH: No GU PSH   ? ?NON-GU PSH: Hernia Repair, Bilateral, (1955, 1988, 2007) ? ?  ? ?GU PMH: ED due to arterial insufficiency - 10/25/2021, - 07/26/2021 ?Elevated PSA - 10/25/2021, - 07/26/2021, - 2019, - 2019 ?  ?   ?PMH Notes: osteopenia, cardiovascular disease, diverticulitis, pre-cancerous- skin  ? ?NON-GU PMH: Arthritis ?Depression ?Glaucoma ?Hypercholesterolemia ?Hypertension ?  ? ?FAMILY HISTORY: 1 son - Son ?4 daughters - Daughter ?Lung Cancer - Father ?Prostate Cancer - Cousin  ? ?SOCIAL HISTORY: Marital Status: Married ?Preferred Language: [place]; Ethnicity: Not Hispanic Or Latino; Race: White ?Current Smoking Status: Patient does not smoke anymore. Has not smoked since 07/04/1972. Smoked for 5 years. Smoked 1/2 pack per day.  ? ?Tobacco Use Assessment Completed: Used  Tobacco in last 30 days? ?Does drink.  ?Drinks 3 caffeinated drinks per day. 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 ?  ? ?REVIEW OF SYSTEMS:    ?GU Review Male:   Patient denies frequent urination, hard to postpone urination, burning/ pain with urination, get up at night to urinate, leakage of urine, stream starts and stops, trouble starting your stream, have to strain to urinate , erection problems, and penile pain.  ?Gastrointestinal (Upper):   Patient denies nausea, vomiting, and indigestion/ heartburn.  ?Gastrointestinal (Lower):   Patient denies diarrhea and constipation.  ?Constitutional:   Patient denies fever, night sweats, weight loss, and fatigue.  ?Skin:   Patient denies skin rash/ lesion and itching.  ?Eyes:   Patient denies blurred vision and double vision.  ?Ears/ Nose/ Throat:   Patient denies sore throat and sinus problems.  ?Hematologic/Lymphatic:   Patient denies swollen glands and easy bruising.  ?Cardiovascular:   Patient denies chest pains and leg swelling.  ?Respiratory:   Patient denies cough and shortness of breath.  ?Endocrine:   Patient denies excessive thirst.  ?Musculoskeletal:   Patient denies back pain and joint pain.  ?Neurological:   Patient denies headaches and dizziness.  ?Psychologic:   Patient denies depression and anxiety.  ? ?VITAL SIGNS: None  ? ?Complexity of Data:  ?Source Of History:  Patient  ?Lab Test Review:   PSA  ?Records Review:   Previous Doctor Records, Previous Patient Records, POC Tool  ?Urine Test Review:   Urinalysis  ?  X-Ray Review: C.T. Abdomen/Pelvis: Reviewed Films. Discussed With Patient.  ?  ? 10/18/21 07/26/21 10/15/18  ?PSA  ?Total PSA 5.60 ng/mL 5.24 ng/mL 3.47 ng/mL  ?Free PSA 0.53 ng/mL 0.55 ng/mL   ?% Free PSA 9 % PSA 10 % PSA   ? ? ?PROCEDURES:    ? ?     Urinalysis - 81003 ?Dipstick Dipstick Cont'd  ?Color: Yellow Bilirubin: Neg  ?Appearance: Clear Ketones: Neg  ?Specific Gravity: 1.015 Blood: Neg  ?pH: 5.5  Protein: Trace  ?Glucose: Neg Urobilinogen: 0.2  ?  Nitrites: Neg  ?  Leukocyte Esterase: Neg  ? ? ?Notes:  ? ?  ? ?ASSESSMENT:  ?    ICD-10 Details  ?1 GU:   Bladder Diverticulum - N32.3   ?2   Elevated PSA - R97.20   ?  ? ?PLAN:    ? ?      Document ?Letter(s):  Created for Patient: Clinical Summary  ? ? ?     Notes:   I reviewed the CT scan with the patient, showed on the images, and we discussed the issues. I told him that we could certainly fix his bladder diverticulum as well as inguinal hernia under the same anesthesia. I recommended that we do this robotically. I went through a robotic partial cystectomy with the patient. Our plan would be to excise a diverticulum and close the bladder. I reviewed the surgery with him in detail we discussed the risk and associated benefits. He understands that following the surgery he will need the catheter for 7 to 10 days. I explained to him this was an outpatient surgery and he would likely be discharged home.  ? ?The patient will need to follow-up with general surgery ASAP, preferably a robotic surgeon, who can perform an inguinal hernia repair robotically. Hopefully we can coordinate schedules quickly.  ?  ? ?

## 2022-02-23 NOTE — Discharge Instructions (Addendum)
?Driving:  It is against the law to drive when taking narcotic pain medications.  You should wait at least 8 hours after taking your last pain pill before driving.  Further, you should not drive if you are to sore to react quickly or if you have something impeding your ability to drive.  ? ?Activity:  You are encouraged to ambulate frequently (about every hour during waking hours) to help prevent blood clots from forming in your legs or lungs.  However, you should not engage in any heavy lifting (> 10-15 lbs), strenuous activity, or straining. ? ?Diet: You should advance your diet as instructed by your physician.  It will be normal to have some bloating, nausea, and abdominal discomfort intermittently. ? ?Prescriptions:  You will be provided a prescription for pain medication to take as needed.  If your pain is not severe enough to require the prescription pain medication, you may take extra strength Tylenol instead which will have less side effects.  You should also take a prescribed stool softener to avoid straining with bowel movements as the prescription pain medication may constipate you. ? ?Incisions: You may remove your dressing bandages 48 hours after surgery if not removed in the hospital.  You will either have some small staples or special tissue glue at each of the incision sites. Once the bandages are removed (if present), the incisions may stay open to air.  You may start showering (but not soaking or bathing in water) the 2nd day after surgery and the incisions simply need to be patted dry after the shower.  No additional care is needed. ? ?What to call us about: You should call the office [phone]) if you develop fever > 101 or develop persistent vomiting. Activity:  You are encouraged to ambulate frequently (about every hour during waking hours) to help prevent blood clots from forming in your legs or lungs.  However, you should not engage in any heavy lifting (> 10-15 lbs), strenuous activity,  or straining. ? ? ?  ? ?Foley Catheter Care ?A soft, flexible tube (Foley catheter) may have been placed in your bladder to drain urine and fluid. Follow these instructions: ?Taking Care of the Catheter ?Keep the area where the catheter leaves your body clean.  ?Attach the catheter to the leg so there is no tension on the catheter.  ?Keep the drainage bag below the level of the bladder, but keep it OFF the floor.  ?Do not take long soaking baths. Your caregiver will give instructions about showering.  ?Wash your hands before touching ANYTHING related to the catheter or bag.  ?Using mild soap and warm water on a washcloth:  ?Clean the area closest to the catheter insertion site using a circular motion around the catheter.  ?Clean the catheter itself by wiping AWAY from the insertion site for several inches down the tube.  ?NEVER wipe upward as this could sweep bacteria up into the urethra (tube in your body that normally drains the bladder) and cause infection.  ?Place a small amount of sterile lubricant at the tip of the penis where the catheter is entering.  ?Taking Care of the Drainage Bags ?Two drainage bags may be taken home: a large overnight drainage bag, and a smaller leg bag which fits underneath clothing.  ?It is okay to wear the overnight bag at any time, but NEVER wear the smaller leg bag at night.  ?Keep the drainage bag well below the level of your bladder. This prevents backflow of urine  into the bladder and allows the urine to drain freely.  ?Anchor the tubing to your leg to prevent pulling or tension on the catheter. Use tape or a leg strap provided by the hospital.  ?Empty the drainage bag when it is 1/2 to 3/4 full. Wash your hands before and after touching the bag.  ?Periodically check the tubing for kinks to make sure there is no pressure on the tubing which could restrict the flow of urine.  ?Changing the Drainage Bags ?Cleanse both ends of the clean bag with alcohol before changing.  ?Pinch off  the rubber catheter to avoid urine spillage during the disconnection.  ?Disconnect the dirty bag and connect the clean one.  ?Empty the dirty bag carefully to avoid a urine spill.  ?Attach the new bag to the leg with tape or a leg strap.  ?Cleaning the Drainage Bags ?Whenever a drainage bag is disconnected, it must be cleaned quickly so it is ready for the next use.  ?Wash the bag in warm, soapy water.  ?Rinse the bag thoroughly with warm water.  ?Soak the bag for 30 minutes in a solution of white vinegar and water (1 cup vinegar to 1 quart warm water).  ?Rinse with warm water.  ?SEEK MEDICAL CARE IF:  ?You have chills or night sweats.  ?You are leaking around your catheter or have problems with your catheter. It is not uncommon to have sporadic leakage around your catheter as a result of bladder spasms. If the leakage stops, there is not much need for concern. If you are uncertain, call your caregiver.  ?You develop side effects that you think are coming from your medicines.  ?SEEK IMMEDIATE MEDICAL CARE IF:  ?You are suddenly unable to urinate. Check to see if there are any kinks in the drainage tubing that may cause this. If you cannot find any kinks, call your caregiver immediately. This is an emergency.  ?You develop shortness of breath or chest pains.  ?Bleeding persists or clots develop in your urine.  ?You have a fever.  ?You develop pain in your back or over your lower belly (abdomen).  ?You develop pain or swelling in your legs.  ?Any problems you are having get worse rather than better.  ?MAKE SURE YOU:  ?Understand these instructions.  ?Will watch your condition.  ?Will get help right away if you are not doing well or get worse.   ? ? ?CC[facility] _______[facility] ? ?INGUINAL HERNIA REPAIR: POST OP INSTRUCTIONS ? ?Always review your discharge instruction sheet given to you by the facility where your surgery was performed. ?IF YOU HAVE DISABILITY OR FAMILY LEAVE FORMS, YOU MUST BRING THEM TO  THE OFFICE FOR PROCESSING.   ?DO NOT GIVE THEM TO YOUR DOCTOR. ? ?1. A  prescription for pain medication may be given to you upon discharge.  Take your pain medication as prescribed, if needed.  If narcotic pain medicine is not needed, then you may take acetaminophen (Tylenol) or ibuprofen (Advil) as needed. ?2. Take your usually prescribed medications unless otherwise directed. ?If you need a refill on your pain medication, please contact your pharmacy.  They will contact our office to request authorization. Prescriptions will not be filled after 5 pm or on week-ends. ?3. You should follow a light diet the first 24 hours after arrival home, such as soup and crackers, etc.  Be sure to include lots of fluids daily.  Resume your normal diet the day after surgery. ?4.Most patients will  experience some swelling and bruising around the umbilicus or in the groin and scrotum.  Ice packs and reclining will help.  Swelling and bruising can take several days to resolve.  ?6. It is common to experience some constipation if taking pain medication after surgery.  Increasing fluid intake and taking a stool softener (such as Colace) will usually help or prevent this problem from occurring.  A mild laxative (Milk of Magnesia or Miralax) should be taken according to package directions if there are no bowel movements after 48 hours. ?7. Unless discharge instructions indicate otherwise, you may remove your bandages 24-48 hours after surgery, and you may shower at that time.  You may have steri-strips (small skin tapes) in place directly over the incision.  These strips should be left on the skin for 7-10 days.  If your surgeon used skin glue on the incision, you may shower in 24 hours.  The glue will flake off over the next 2-3 weeks.  Any sutures or staples will be removed at the office during your follow-up visit. ?8. ACTIVITIES:  You may resume regular (light) daily activities beginning the next day--such as daily self-care,  walking, climbing stairs--gradually increasing activities as tolerated.  You may have sexual intercourse when it is comfortable.  Refrain from any heavy lifting or straining until approved by your doctor. ?

## 2022-02-23 NOTE — Interval H&P Note (Signed)
History and Physical Interval Note: ? ?02/23/2022 ?7:26 AM ? ?Collin Garza  has presented today for surgery, with the diagnosis of RECURRENT LEFT INGUINAL HERNIA.  The various methods of treatment have been discussed with the patient and family. After consideration of risks, benefits and other options for treatment, the patient has consented to  Procedure(s): ?ROBOTIC LEFT INGUINAL HERNIA REPAIR WITH MESH (Left) ?XI ROBOTIC ASSISTED LAPAROSCOPIC PARTIAL CYSTECTOMY;CYSTOSCOPY (N/A) as a surgical intervention.  The patient's history has been reviewed, patient examined, no change in status, stable for surgery.  I have reviewed the patient's chart and labs.  Questions were answered to the patient's satisfaction.   ? ? ?Ardis Hughs ? ? ?

## 2022-02-23 NOTE — H&P (Signed)
?Chief Complaint: Hernia ? ? ?History of Present Illness: ?Collin Garza is a 71 y.o. male who is seen today as an office consultation at the request of Dr. Louis Meckel for evaluation of Hernia ?Collin Garza  ?Patient is a 71 year old male who comes in with history of hypertension, hyperlipidemia a recurrent left inguinal hernia and bladder diverticulum. ?Patient has been seen by Dr. Louis Meckel for evaluation of excision of bladder diverticulum. Patient has a recurrent left inguinal hernia. He would like to have this repaired concurrently. ? ?He states that hernias been there for several years. Has gotten larger. He does go to the gym does do some weight lifting. He states he is able to massage the hernia back without any trouble. He had no signs or symptoms of incarceration or strangulation. ? ?Review of Systems: ?A complete review of systems was obtained from the patient. I have reviewed this information and discussed as appropriate with the patient. See HPI as well for other ROS. ? ?ROS  ? ?Medical History: ?Past Medical History:  ?Diagnosis Date  ? Arthritis  ? Hypertension  ? ?There is no problem list on file for this patient. ? ?Past Surgical History:  ?Procedure Laterality Date  ? HERNIA REPAIR  ? ? ?Allergies  ?Allergen Reactions  ? Propoxyphene N-Acetaminophen Nausea  ?Other reaction(s): NAUSEA, VOMITING ? ? Penicillins Rash  ? ?Current Outpatient Medications on File Prior to Visit  ?Medication Sig Dispense Refill  ? amLODIPine (NORVASC) 5 MG tablet  ? ascorbic acid, vitamin C, (VITAMIN C) 500 MG tablet Take 500 mg by mouth once daily  ? atorvastatin (LIPITOR) 20 MG tablet  ? calcium citrate-vitamin D3 (CITRACAL+D) 315 mg-5 mcg (200 unit) tablet Take 2 tablets by mouth 2 (two) times daily with meals  ? cholecalciferol (VITAMIN D3) 2,000 unit capsule Take 2,000 Units by mouth once daily  ? sildenafiL (VIAGRA) 100 MG tablet TAKE 1/2 TABLET BY MOUTH EVERY DAY AS NEEDED  ? ?No current facility-administered medications on file  prior to visit.  ? ?Family History  ?Problem Relation Age of Onset  ? Breast cancer Sister  ? ? ?Social History  ? ?Tobacco Use  ?Smoking Status Former  ? Types: Cigarettes  ?Smokeless Tobacco Never  ? ? ?Social History  ? ?Socioeconomic History  ? Marital status: Married  ?Tobacco Use  ? Smoking status: Former  ?Types: Cigarettes  ? Smokeless tobacco: Never  ?Vaping Use  ? Vaping Use: Never used  ?Substance and Sexual Activity  ? Alcohol use: Yes  ? Drug use: Never  ? ?Objective:  ? ?Vitals:  ?01/19/22 0835  ?BP: (!) 142/80  ?Pulse: 81  ?Temp: 36.7 ?C (98 ?F)  ?SpO2: 98%  ?Weight: 79.5 kg (175 lb 3.2 oz)  ?Height: 177.8 cm ('5\' 10"'$ )  ? ?Body mass index is 25.14 kg/m?Collin Garza ?Physical Exam ?Constitutional:  ?Appearance: Normal appearance.  ?HENT:  ?Head: Normocephalic and atraumatic.  ?Nose: Nose normal. No congestion.  ?Mouth/Throat:  ?Mouth: Mucous membranes are moist.  ?Pharynx: Oropharynx is clear.  ?Eyes:  ?Pupils: Pupils are equal, round, and reactive to light.  ?Cardiovascular:  ?Rate and Rhythm: Normal rate and regular rhythm.  ?Pulses: Normal pulses.  ?Heart sounds: Normal heart sounds. No murmur heard. ?No friction rub. No gallop.  ?Pulmonary:  ?Effort: Pulmonary effort is normal. No respiratory distress.  ?Breath sounds: Normal breath sounds. No stridor. No wheezing, rhonchi or rales.  ?Abdominal:  ?General: Abdomen is flat.  ?Hernia: A hernia is present. Hernia is present in the left inguinal area. There is  no hernia in the right inguinal area.  ?Musculoskeletal:  ?General: Normal range of motion.  ?Cervical back: Normal range of motion.  ?Skin: ?General: Skin is warm and dry.  ?Neurological:  ?General: No focal deficit present.  ?Mental Status: He is alert and oriented to person, place, and time.  ?Psychiatric:  ?Mood and Affect: Mood normal.  ?Thought Content: Thought content normal.  ? ? ? ?Assessment and Plan:  ?Diagnoses and all orders for this visit: ? ?Unilateral recurrent inguinal hernia without  obstruction or gangrene ? ? ?Lindbergh Collin Garza is a 71 y.o. male  ? ?1. We will proceed to the OR for a robotic left inguinal hernia repair with mesh. We will coordinate this with Dr. Louis Meckel. ?2. All risks and benefits were discussed with the patient, to generally include infection, bleeding, damage to surrounding structures, acute and chronic nerve pain, and recurrence. Alternatives were offered and described. All questions were answered and the patient voiced understanding of the procedure and wishes to proceed at this point. ? ?No follow-ups on file. ? ?Ralene Ok, MD, FACS ?[facility], [place] ?General & Minimally Invasive Surgery ?

## 2022-02-23 NOTE — Anesthesia Postprocedure Evaluation (Signed)
Anesthesia Post Note ? ?Patient: Collin Garza ? ?Procedure(s) Performed: ROBOTIC LEFT INGUINAL HERNIA REPAIR WITH MESH (Left) ?XI ROBOTIC ASSISTED LAPAROSCOPIC PARTIAL CYSTECTOMY, FLEXIBLE CYSTOSCOPY ? ?  ? ?Patient location during evaluation: PACU ?Anesthesia Type: General ?Level of consciousness: awake and alert ?Pain management: pain level controlled ?Vital Signs Assessment: post-procedure vital signs reviewed and stable ?Respiratory status: spontaneous breathing, nonlabored ventilation, respiratory function stable and patient connected to nasal cannula oxygen ?Cardiovascular status: blood pressure returned to baseline and stable ?Postop Assessment: no apparent nausea or vomiting ?Anesthetic complications: no ? ? ?No notable events documented. ? ?Last Vitals:  ?Vitals:  ? 02/23/22 1330 02/23/22 1345  ?BP: 115/72 119/78  ?Pulse:    ?Resp:    ?Temp:    ?SpO2:    ?  ?Last Pain:  ?Vitals:  ? 02/23/22 1300  ?TempSrc:   ?PainSc: 4   ? ? ?  ?  ?  ?  ?  ?  ? ?Effie Berkshire ? ? ? ? ?

## 2022-02-23 NOTE — Op Note (Signed)
02/23/2022 ? ?10:59 AM ? ?PATIENT:  Collin Garza  71 y.o. male ? ?PRE-OPERATIVE DIAGNOSIS:  RECURRENT LEFT INGUINAL HERNIA, BLADDER DIVERTICULUM ? ?POST-OPERATIVE DIAGNOSIS:  RECURRENT LEFT INDIRECT INGUINAL HERNIA, BLADDER DIVERTICULUM ? ?PROCEDURE:  Procedure(s): ?ROBOTIC LEFT INGUINAL HERNIA REPAIR WITH MESH (Left) ? ? ?SURGEON:  Surgeon(s) and Role: ?Panel 1: ?   Ralene Ok, MD - Primary ? ?ANESTHESIA:   local, regional, and general ? ?EBL:  5 mL  ? ?BLOOD ADMINISTERED:none ? ?DRAINS: none  ? ?LOCAL MEDICATIONS USED:  BUPIVICAINE  and OTHER EXPAREL ? ?SPECIMEN:  No Specimen ? ?DISPOSITION OF SPECIMEN:  N/A ? ?COUNTS:  YES ? ?TOURNIQUET:  * No tourniquets in log * ? ?DICTATION: .Dragon Dictation ? ?Details of the procedure:  ? ?Findings: Patient with a medium sized Direct hernia. ? ?Details: ? ?Dr. Louis Meckel had finished the first part of the procedure prior to my entering the room.  The robot was docked.  He will dictate his part under separate cover. ? ? ?At this time the left-sided peritoneum was taken down from the medial umbilical ligament laterally. The pre-peritoneal space was entered. Dissection was taken down to Cooper's ligament. At this time it was apparent there was a medium sized direct hernia. The hernia was retracted and dissected away from the transversalis fascia. Transversalis fascia spontaneously retracted. The hernia contained mainly preperitoneal fat. At this time proceeded clean out the rest Cooper's ligament and the medial to lateral direction. A proceeded laterally to dissect the spermatic cord. The spermatic cord was circumferentially dissected away from the surrounding musculature and tissue. The vas deferens was identified and protected all portions of the case.  At this time I proceeded to create a pocket laterally for the mesh. Once the pocket was created the peritoneum was stripped back to approximately the base of the cord. At this time the piece of Progrip 16x12cm mesh was in  placed into the dissected area. This covered both the direct and indirect spaces appropriately. This also covered  The femoral space.  The mesh lay flat from medial to lateral. At this time a 3-0 V- lock stitch was used to close the peritoneum in a standard running fashion. ? ?At this time the robot was undocked. A field TAP block was placed bilaterally. The umbilical port site was reapproximated using a 0 Vicryl via an Endo Close device ?3.  The 37m assist port fascia was reapproximated with vicryl and endoclose x 2. All ports were removed. The skin was reapproximated and all port sites using 4-0 Monocryl subcuticular fashion. ? ?The patient the procedure well was taken to the recovery  ? ? ?PLAN OF CARE: Discharge to home after PACU ? ?PATIENT DISPOSITION:  PACU - hemodynamically stable. ?  ?Delay start of Pharmacological VTE agent (>24hrs) due to surgical blood loss or risk of bleeding: not applicable ? ?
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
## 2022-02-24 ENCOUNTER — Encounter (HOSPITAL_COMMUNITY): Payer: Self-pay | Admitting: General Surgery

## 2022-02-26 LAB — SURGICAL PATHOLOGY

## 2022-03-01 ENCOUNTER — Other Ambulatory Visit: Payer: Self-pay | Admitting: Primary Podiatric Medicine

## 2022-03-01 DIAGNOSIS — L97511 Non-pressure chronic ulcer of other part of right foot limited to breakdown of skin: Secondary | ICD-10-CM

## 2022-05-11 DIAGNOSIS — H40013 Open angle with borderline findings, low risk, bilateral: Secondary | ICD-10-CM | POA: Diagnosis not present

## 2022-05-12 DIAGNOSIS — H5213 Myopia, bilateral: Secondary | ICD-10-CM | POA: Diagnosis not present

## 2022-05-17 ENCOUNTER — Encounter: Payer: Self-pay | Admitting: Family Medicine

## 2022-05-17 ENCOUNTER — Ambulatory Visit (INDEPENDENT_AMBULATORY_CARE_PROVIDER_SITE_OTHER): Payer: Medicare Other | Admitting: Family Medicine

## 2022-05-17 VITALS — BP 132/70 | HR 65 | Temp 98.2°F | Ht 70.0 in | Wt 166.0 lb

## 2022-05-17 DIAGNOSIS — I10 Essential (primary) hypertension: Secondary | ICD-10-CM

## 2022-05-17 DIAGNOSIS — E782 Mixed hyperlipidemia: Secondary | ICD-10-CM

## 2022-05-17 MED ORDER — AMLODIPINE BESYLATE 10 MG PO TABS: 10.0000 mg | ORAL_TABLET | Freq: Every day | ORAL | 3 refills | Status: DC

## 2022-05-17 NOTE — Progress Notes (Signed)
Established Patient Office Visit  Subjective   Patient ID: Collin Garza, male    DOB: 1951-03-27  Age: 71 y.o. MRN: [id number]  No chief complaint on file.   HPI follow-up of hypertension status post increased amlodipine to 10 mg and elevated cholesterol on atorvastatin 20 mg daily.  Doing well with both.  Blood pressure is responded well.  He is status post robotic surgery for left inguinal hernia and bladder diverticulum back in March.  Continues to recover with some lingering fatigue but is otherwise doing well.  Status post recent URI that ran to the family.  Had tested negative for COVID and has recovered.    Review of Systems  Constitutional: Negative.   HENT: Negative.    Eyes:  Negative for blurred vision, discharge and redness.  Respiratory: Negative.  Negative for cough and wheezing.   Cardiovascular: Negative.   Gastrointestinal:  Negative for abdominal pain.  Genitourinary: Negative.   Musculoskeletal: Negative.  Negative for myalgias.  Skin:  Negative for rash.  Neurological:  Negative for tingling, loss of consciousness and weakness.  Endo/Heme/Allergies:  Negative for polydipsia.      Objective:     BP 132/70 (BP Location: Left Arm, Patient Position: Sitting, Cuff Size: Normal)   Pulse 65   Temp 98.2 F (36.8 C) (Temporal)   Ht '5\' 10"'$  (1.778 m)   Wt 166 lb (75.3 kg)   SpO2 98%   BMI 23.82 kg/m  Wt Readings from Last 3 Encounters:  05/17/22 166 lb (75.3 kg)  02/23/22 167 lb 1.7 oz (75.8 kg)  02/14/22 167 lb (75.8 kg)      Physical Exam Constitutional:      General: He is not in acute distress.    Appearance: Normal appearance. He is not ill-appearing, toxic-appearing or diaphoretic.  HENT:     Head: Normocephalic and atraumatic.     Right Ear: External ear normal.     Left Ear: External ear normal.  Eyes:     General: No scleral icterus.       Right eye: No discharge.        Left eye: No discharge.     Extraocular Movements: Extraocular  movements intact.     Conjunctiva/sclera: Conjunctivae normal.  Cardiovascular:     Rate and Rhythm: Normal rate and regular rhythm.  Pulmonary:     Effort: Pulmonary effort is normal. No respiratory distress.     Breath sounds: Normal breath sounds. No wheezing or rales.  Abdominal:     General: Bowel sounds are normal.     Tenderness: There is no abdominal tenderness. There is no guarding.    Musculoskeletal:     Cervical back: No rigidity or tenderness.  Skin:    General: Skin is warm and dry.  Neurological:     Mental Status: He is alert and oriented to person, place, and time.  Psychiatric:        Mood and Affect: Mood normal.        Behavior: Behavior normal.      No results found for any visits on 05/17/22.    The 10-year ASCVD risk score (Arnett DK, et al., 2019) is: 15.1%    Assessment & Plan:   Problem List Items Addressed This Visit       Cardiovascular and Mediastinum   Essential hypertension   Relevant Medications   amLODipine (NORVASC) 10 MG tablet     Other   HYPERLIPIDEMIA - Primary   Relevant Medications  amLODipine (NORVASC) 10 MG tablet    Return in about 6 months (around 11/16/2022), or if symptoms worsen or fail to improve.  Blood pressure well controlled now with higher dose of amlodipine. Continue amlodipine 10 mg daily atorvastatin 20 mg daily.  Libby Maw, MD

## 2022-06-05 DIAGNOSIS — R972 Elevated prostate specific antigen [PSA]: Secondary | ICD-10-CM | POA: Diagnosis not present

## 2022-06-12 DIAGNOSIS — R972 Elevated prostate specific antigen [PSA]: Secondary | ICD-10-CM | POA: Diagnosis not present

## 2022-07-25 DIAGNOSIS — R972 Elevated prostate specific antigen [PSA]: Secondary | ICD-10-CM | POA: Diagnosis not present

## 2022-09-14 ENCOUNTER — Telehealth: Payer: Self-pay

## 2022-09-14 NOTE — Telephone Encounter (Signed)
Calling pt to get scheduled for AWV. LVM for pt to call the office to schedule.

## 2022-09-28 ENCOUNTER — Ambulatory Visit: Payer: Medicare Other

## 2022-09-28 VITALS — Ht 70.0 in | Wt 165.0 lb

## 2022-09-28 NOTE — Progress Notes (Signed)
Subjective:   Collin Garza is a 71 y.o. male who presents for Medicare Annual/Subsequent preventive examination.  I connected with  Orby Tangen Merrihew on 09/28/22 by a video enabled telemedicine application and verified that I am speaking with the correct person using two identifiers.   I discussed the limitations of evaluation and management by telemedicine. The patient expressed understanding and agreed to proceed.   Location of Patient: home Location of Provider: office Persons participating in Visiit:   [employer], [place]. Nikitia Asbill, [place]  Review of Systems    NA Cardiac Risk Factors include: none     Objective:    Today's Vitals   09/28/22 0845  Weight: 165 lb (74.8 kg)  Height: '5\' 10"'$  (1.778 m)   Body mass index is 23.68 kg/m.     09/28/2022    9:10 AM 02/08/2022   10:10 AM 09/22/2015    1:54 PM  Advanced Directives  Does Patient Have a Medical Advance Directive? Yes Yes Yes  Type of Advance Directive Living will [place];Living will [place];Living will  Does patient want to make changes to medical advance directive? No - Patient declined  No - Patient declined  Copy of [place] in Chart?  No - copy requested No - copy requested    Current Medications (verified) Outpatient Encounter Medications as of 09/28/2022  Medication Sig   amLODipine (NORVASC) 10 MG tablet Take 1 tablet (10 mg total) by mouth daily.   atorvastatin (LIPITOR) 20 MG tablet TAKE 1 TABLET(20 MG) BY MOUTH DAILY (Patient taking differently: TAKES IN THE EVENING)   calcium-vitamin D (OSCAL 500/200 D-3) 500-200 MG-UNIT tablet Take 1 tablet by mouth 2 (two) times daily. (Patient taking differently: Take 2 tablets by mouth 2 (two) times daily.)   cholecalciferol (VITAMIN D) 25 MCG (1000 UNIT) tablet Take 1,000 Units by mouth daily.   Omega-3 Fatty Acids (OMEGA 3 PO) Take 2 tablets by mouth daily. Dr. Leanna Battles   OVER THE COUNTER MEDICATION Take  2 tablets by mouth daily. [place] Prostate solution   sildenafil (VIAGRA) 100 MG tablet Take 50 mg by mouth daily as needed for erectile dysfunction.   vitamin C (ASCORBIC ACID) 500 MG tablet Take 500-1,000 mg by mouth daily.   No facility-administered encounter medications on file as of 09/28/2022.    Allergies (verified) Lisinopril, Other, Propoxyphene n-acetaminophen, and Penicillins   History: Past Medical History:  Diagnosis Date   Arthritis    LEFT HIP AND SHOULDER   Depression    Hypertension    Skin cancer    Past Surgical History:  Procedure Laterality Date   COLONOSCOPY     INGUINAL HERNIA REPAIR     x3   ROBOTIC ASSISTED LAPAROSCOPIC BLADDER DIVERTICULECTOMY N/A 02/23/2022   Procedure: XI ROBOTIC ASSISTED LAPAROSCOPIC PARTIAL CYSTECTOMY, FLEXIBLE CYSTOSCOPY;  Surgeon: Ardis Hughs, MD;  Location: [facility];  Service: Urology;  Laterality: N/A;   SKIN CANCER EXCISION     Family History  Problem Relation Age of Onset   Breast cancer Maternal Aunt    Diabetes Maternal Uncle    Heart disease Maternal Uncle    Breast cancer Maternal Grandmother    Pancreatic cancer Maternal Grandmother    Cancer Maternal Grandmother    Prostate cancer Cousin    Stomach cancer Paternal Aunt    Depression Mother    Early death Mother    Heart attack Mother    Cancer Father    COPD  Father    Hearing loss Father    Arthritis Brother    Alcohol abuse Daughter    Depression Daughter    Diabetes Daughter    COPD Paternal Grandmother    Hearing loss Brother    Heart attack Brother    Alcohol abuse Brother    Asthma Brother    COPD Brother    Depression Brother    Colon cancer Neg Hx    Celiac disease Neg Hx    Cirrhosis Neg Hx    Clotting disorder Neg Hx    Colitis Neg Hx    Colon polyps Neg Hx    Crohn's disease Neg Hx    Cystic fibrosis Neg Hx    Esophageal cancer Neg Hx    Hemochromatosis Neg Hx    Inflammatory bowel disease Neg Hx    Irritable bowel syndrome Neg  Hx    Kidney disease Neg Hx    Liver cancer Neg Hx    Liver disease Neg Hx    Ovarian cancer Neg Hx    Rectal cancer Neg Hx    Ulcerative colitis Neg Hx    Uterine cancer Neg Hx    Wilson's disease Neg Hx    Social History   Socioeconomic History   Marital status: Married    Spouse name: Not on file   Number of children: Not on file   Years of education: Not on file   Highest education level: Not on file  Occupational History   Not on file  Tobacco Use   Smoking status: Former    Packs/day: 0.25    Years: 3.00    Total pack years: 0.75    Types: Cigarettes   Smokeless tobacco: Never   Tobacco comments:    quit when i was in the service  Vaping Use   Vaping Use: Never used  Substance and Sexual Activity   Alcohol use: Yes    Alcohol/week: 2.0 - 3.0 standard drinks of alcohol    Types: 2 - 3 Cans of beer per week    Comment: socially   Drug use: No   Sexual activity: Yes  Other Topics Concern   Not on file  Social History Narrative   Not on file   Social Determinants of Health   Financial Resource Strain: Low Risk  (09/28/2022)   Overall Financial Resource Strain (CARDIA)    Difficulty of Paying Living Expenses: Not hard at all  Food Insecurity: No Food Insecurity (09/28/2022)   Hunger Vital Sign    Worried About Running Out of Food in the Last Year: Never true    Ran Out of Food in the Last Year: Never true  Transportation Needs: No Transportation Needs (09/28/2022)   PRAPARE - Hydrologist (Medical): No    Lack of Transportation (Non-Medical): No  Physical Activity: Sufficiently Active (09/28/2022)   Exercise Vital Sign    Days of Exercise per Week: 7 days    Minutes of Exercise per Session: 50 min  Stress: No Stress Concern Present (09/28/2022)   [place]    Feeling of Stress : Not at all  Social Connections: [place] (09/28/2022)   Social  Connection and Isolation Panel [NHANES]    Frequency of Communication with Friends and Family: More than three times a week    Frequency of Social Gatherings with Friends and Family: More than three times a week    Attends Religious Services:  1 to 4 times per year    Active Member of [place] or Organizations: Yes    Attends Archivist Meetings: 1 to 4 times per year    Marital Status: Married    Tobacco Counseling Counseling given: Not Answered Tobacco comments: quit when i was in the service   Clinical Intake:  Pre-visit preparation completed: Yes  Pain : No/denies pain     BMI - recorded: 23.96 Nutritional Status: BMI of 19-24  Normal Nutritional Risks: None Diabetes: No  How often do you need to have someone help you when you read instructions, pamphlets, or other written materials from your doctor or pharmacy?: 1 - Never What is the last grade level you completed in school?: 3 1/2 years college  Diabetic?no  Interpreter Needed?: No  Information entered by :: Armandina Gemma, [place] [facility] Daily Living    09/28/2022    9:07 AM 02/08/2022   10:11 AM  In your present state of health, do you have any difficulty performing the following activities:  Hearing? 0   Vision? 0   Difficulty concentrating or making decisions? 0   Walking or climbing stairs? 0   Dressing or bathing? 0   Doing errands, shopping? 0 0  Preparing Food and eating ? N   Using the Toilet? N   In the past six months, have you accidently leaked urine? N   Do you have problems with loss of bowel control? N   Managing your Medications? N   Managing your Finances? N   Housekeeping or managing your Housekeeping? N     Patient Care Team: Libby Maw, MD as PCP - General (Family Medicine)  Indicate any recent Medical Services you may have received from other than [facility] providers in the past year (date may be approximate).     Assessment:   This is a routine wellness  examination for [employer].  Hearing/Vision screen No results found.  Dietary issues and exercise activities discussed: Current Exercise Habits: Home exercise routine, Type of exercise: walking;stretching, Time (Minutes): 50, Frequency (Times/Week): 7, Weekly Exercise (Minutes/Week): 350, Intensity: Moderate, Exercise limited by: cardiac condition(s)   Goals Addressed   None    Depression Screen    09/28/2022    9:07 AM 05/17/2022    8:53 AM 02/14/2022    9:36 AM 05/24/2021    2:02 PM 05/17/2021   10:42 AM 05/17/2021   10:11 AM 10/05/2020    1:24 PM  PHQ 2/9 Scores  PHQ - 2 Score 0 0 0 0 1 0 0  PHQ- 9 Score     2      Fall Risk    09/28/2022    9:07 AM 05/17/2022    8:53 AM 02/14/2022    9:36 AM 11/16/2021   10:00 AM 05/24/2021    2:02 PM  Fall Risk   Falls in the past year? 0 0 0 0 0  Number falls in past yr: 0 0 0 0   Injury with Fall? 0   0   Risk for fall due to : No Fall Risks   No Fall Risks   Follow up Falls evaluation completed        FALL RISK PREVENTION PERTAINING TO THE HOME:  Any stairs in or around the home? Yes  If so, are there any without handrails? No  Home free of loose throw rugs in walkways, pet beds, electrical cords, etc? Yes  Adequate lighting in your home to reduce  risk of falls? Yes   ASSISTIVE DEVICES UTILIZED TO PREVENT FALLS:  Life alert? No  Use of a cane, walker or w/c? No  Grab bars in the bathroom? Yes  Shower chair or bench in shower? No  Elevated toilet seat or a handicapped toilet? No   TIMED UP AND GO:  Was the test performed? No .  Length of time to ambulate 10 feet: NA sec.     Cognitive Function:        09/28/2022    9:11 AM  6CIT Screen  What Year? 0 points  What month? 0 points  What time? 0 points  Count back from 20 0 points  Months in reverse 0 points  Repeat phrase 0 points  Total Score 0 points    Immunizations Immunization History  Administered Date(s) Administered   Influenza Inj Mdck Quad With  Preservative 11/20/2017   Influenza, High Dose Seasonal PF 09/26/2018, 09/17/2019   Influenza-Unspecified 09/17/2019, 10/31/2021   PFIZER Comirnaty(Gray Top)Covid-19 Tri-Sucrose Vaccine 05/03/2021   PFIZER(Purple Top)SARS-COV-2 Vaccination 02/02/2020, 03/02/2020, 09/18/2020   Pfizer Covid-19 Vaccine Bivalent Booster 81yr & up 12/07/2021   Pneumococcal Conjugate-13 01/05/2020   Pneumococcal Polysaccharide-23 12/25/2018   Td 09/07/2016    TDAP status: Up to date  Flu Vaccine status: Due, Education has been provided regarding the importance of this vaccine. Advised may receive this vaccine at local pharmacy or Health Dept. Aware to provide a copy of the vaccination record if obtained from local pharmacy or Health Dept. Verbalized acceptance and understanding.  Pneumococcal vaccine status: Up to date  Covid-19 vaccine status: Completed vaccines  Qualifies for Shingles Vaccine? No   Zostavax completed No   Shingrix Completed?: No.    Education has been provided regarding the importance of this vaccine. Patient has been advised to call insurance company to determine out of pocket expense if they have not yet received this vaccine. Advised may also receive vaccine at local pharmacy or Health Dept. Verbalized acceptance and understanding.  Screening Tests Health Maintenance  Topic Date Due   Zoster Vaccines- Shingrix (1 of 2) Never done   COVID-19 Vaccine (6 - Pfizer risk series) 02/01/2022   INFLUENZA VACCINE  07/04/2022   Medicare Annual Wellness (AWV)  10/29/2023   COLONOSCOPY (Pts 45-444yrInsurance coverage will need to be confirmed)  12/14/2025   TETANUS/TDAP  09/07/2026   Pneumonia Vaccine 71Years old  Completed   Hepatitis C Screening  Completed   HPV VACCINES  Aged Out    Health Maintenance  Health Maintenance Due  Topic Date Due   Zoster Vaccines- Shingrix (1 of 2) Never done   COVID-19 Vaccine (6 - Pfizer risk series) 02/01/2022   INFLUENZA VACCINE  07/04/2022     Colorectal cancer screening: Type of screening: Colonoscopy. Completed 12/15/2015. Repeat every 10 years  Lung Cancer Screening: (Low Dose CT Chest recommended if Age 69-80ears, 30 pack-year currently smoking OR have quit w/in 15years.) does not qualify.   Lung Cancer Screening Referral:NA  Additional Screening:  Hepatitis C Screening: does not qualify;Completed: 04/25/2016  Vision Screening: Recommended annual ophthalmology exams for early detection of glaucoma and other disorders of the eye. Is the patient up to date with their annual eye exam?  Yes  Who is the provider or what is the name of the office in which the patient attends annual eye exams?  Dr JoHarriet Buttef pt is not established with a provider, would they like to be referred to a provider to establish care?  No .   Dental Screening: Recommended annual dental exams for proper oral hygiene  Community Resource Referral / Chronic Care Management: CRR required this visit?  No   CCM required this visit?  No      Plan:     I have personally reviewed and noted the following in the patient's chart:   Medical and social history Use of alcohol, tobacco or illicit drugs  Current medications and supplements including opioid prescriptions. Patient is not currently taking opioid prescriptions. Functional ability and status Nutritional status Physical activity Advanced directives List of other physicians Hospitalizations, surgeries, and ER visits in previous 12 months Vitals Screenings to include cognitive, depression, and falls Referrals and appointments  In addition, I have reviewed and discussed with patient certain preventive protocols, quality metrics, and best practice recommendations. A written personalized care plan for preventive services as well as general preventive health recommendations were provided to patient.     Konrad Saha, [place]   09/28/2022   Nurse Notes: Non-Face to Face 45 minute visit  Encounter.    Mr. Carlyon , Thank you for taking time to come for your Medicare Wellness Visit. I appreciate your ongoing commitment to your health goals. Please review the following plan we discussed and let me know if I can assist you in the future.   These are the goals we discussed:  Goals   None     This is a list of the screening recommended for you and due dates:  Health Maintenance  Topic Date Due   Zoster (Shingles) Vaccine (1 of 2) Never done   COVID-19 Vaccine (6 - Pfizer risk series) 02/01/2022   Flu Shot  07/04/2022   Medicare Annual Wellness Visit  10/29/2023   Colon Cancer Screening  12/14/2025   Tetanus Vaccine  09/07/2026   Pneumonia Vaccine  Completed   Hepatitis C Screening: USPSTF Recommendation to screen - Ages 18-79 yo.  Completed   HPV Vaccine  Aged Out

## 2022-10-06 ENCOUNTER — Telehealth: Payer: Self-pay

## 2022-10-06 NOTE — Telephone Encounter (Signed)
Patient due for colonoscopy Jan 2027

## 2022-10-31 ENCOUNTER — Encounter: Payer: Self-pay | Admitting: Family Medicine

## 2022-10-31 ENCOUNTER — Ambulatory Visit (INDEPENDENT_AMBULATORY_CARE_PROVIDER_SITE_OTHER): Payer: Medicare Other | Admitting: Family Medicine

## 2022-10-31 VITALS — BP 128/80 | HR 67 | Temp 97.4°F | Ht 69.5 in | Wt 164.0 lb

## 2022-10-31 DIAGNOSIS — Z87891 Personal history of nicotine dependence: Secondary | ICD-10-CM

## 2022-10-31 NOTE — Progress Notes (Signed)
BP 128/80 (BP Location: Left Arm, Patient Position: Sitting, Cuff Size: Normal)   Pulse 67   Temp (!) 97.4 F (36.3 C)   Ht 5' 9.5" (1.765 m)   Wt 164 lb (74.4 kg)   SpO2 98%   BMI 23.87 kg/m    Subjective:    Patient ID: Collin Garza, male    DOB: [birth year], 71 y.o.   MRN: [id number]  HPI: Collin Garza is a 71 y.o. male presenting on 10/31/2022 for comprehensive medical examination. Current medical complaints include:none tough year for him.  Status post bilateral hernia repair and partial resection of bladder.  Has been doing well.  PSA has headed downward.  He currently lives with: Interim Problems from his last visit: no  Functional Status Survey:    FALL RISK:    10/31/2022    2:05 PM 09/28/2022    9:07 AM 05/17/2022    8:53 AM 02/14/2022    9:36 AM 11/16/2021   10:00 AM  [place] in the past year?  0 0 0 0  Number falls in past yr: 0 0 0 0 0  Injury with Fall? 0 0   0  Risk for fall due to :  No Fall Risks   No Fall Risks  Follow up  Falls evaluation completed       Depression Screen    09/28/2022    9:07 AM 05/17/2022    8:53 AM 02/14/2022    9:36 AM 05/24/2021    2:02 PM 05/17/2021   10:42 AM  Depression screen PHQ 2/9  Decreased Interest 0 0 0 0 0  Down, Depressed, Hopeless 0 0 0 0 1  PHQ - 2 Score 0 0 0 0 1  Altered sleeping     1  Tired, decreased energy     0  Change in appetite     0  Feeling bad or failure about yourself      0  Trouble concentrating     0  Moving slowly or fidgety/restless     0  Suicidal thoughts     0  PHQ-9 Score     2  Difficult doing work/chores     Not difficult at all    Advanced Directives <no information>  Past Medical History:  Past Medical History:  Diagnosis Date   Arthritis    LEFT HIP AND SHOULDER   Depression    Hypertension    Skin cancer     Surgical History:  Past Surgical History:  Procedure Laterality Date   COLONOSCOPY     INGUINAL HERNIA REPAIR     x3   ROBOTIC ASSISTED  LAPAROSCOPIC BLADDER DIVERTICULECTOMY N/A 02/23/2022   Procedure: XI ROBOTIC [place], FLEXIBLE CYSTOSCOPY;  Surgeon: Ardis Hughs, MD;  Location: [facility];  Service: Urology;  Laterality: N/A;   SKIN CANCER EXCISION      Medications:  Current Outpatient Medications on File Prior to Visit  Medication Sig   amLODipine (NORVASC) 10 MG tablet Take 1 tablet (10 mg total) by mouth daily.   atorvastatin (LIPITOR) 20 MG tablet TAKE 1 TABLET(20 MG) BY MOUTH DAILY (Patient taking differently: TAKES IN THE EVENING)   calcium-vitamin D (OSCAL 500/200 D-3) 500-200 MG-UNIT tablet Take 1 tablet by mouth 2 (two) times daily. (Patient taking differently: Take 2 tablets by mouth 2 (two) times daily.)   cholecalciferol (VITAMIN D) 25 MCG (1000 UNIT) tablet Take 1,000 Units by mouth daily.  cyanocobalamin (VITAMIN B12) 1000 MCG tablet Take 1,000 mcg by mouth daily.   Omega-3 Fatty Acids (OMEGA 3 PO) Take 2 tablets by mouth daily. Dr. Leanna Battles   OVER THE COUNTER MEDICATION Take 2 tablets by mouth daily. Sinatra Prostate solution   sildenafil (VIAGRA) 100 MG tablet Take 50 mg by mouth daily as needed for erectile dysfunction.   vitamin C (ASCORBIC ACID) 500 MG tablet Take 500-1,000 mg by mouth daily.   No current facility-administered medications on file prior to visit.    Allergies:  Allergies  Allergen Reactions   Lisinopril Cough   Other Nausea Only    Other reaction(s): NAUSEA, VOMITING   Propoxyphene N-Acetaminophen Nausea Only   Penicillins Hives and Rash    Social History:  Social History   Socioeconomic History   Marital status: Married    Spouse name: Not on file   Number of children: Not on file   Years of education: Not on file   Highest education level: Not on file  Occupational History   Not on file  Tobacco Use   Smoking status: Former    Packs/day: 0.25    Years: 3.00    Total pack years: 0.75    Types: Cigarettes   Smokeless  tobacco: Never   Tobacco comments:    quit when i was in the service  Vaping Use   Vaping Use: Never used  Substance and Sexual Activity   Alcohol use: Yes    Alcohol/week: 2.0 - 3.0 standard drinks of alcohol    Types: 2 - 3 Cans of beer per week    Comment: socially   Drug use: No   Sexual activity: Yes  Other Topics Concern   Not on file  Social History Narrative   Not on file   Social Determinants of Health   Financial Resource Strain: Low Risk  (09/28/2022)   Overall Financial Resource Strain (CARDIA)    Difficulty of Paying Living Expenses: Not hard at all  Food Insecurity: No Food Insecurity (09/28/2022)   Hunger Vital Sign    Worried About Running Out of Food in the Last Year: Never true    Ran Out of Food in the Last Year: Never true  Transportation Needs: No Transportation Needs (09/28/2022)   PRAPARE - Hydrologist (Medical): No    Lack of Transportation (Non-Medical): No  Physical Activity: Sufficiently Active (09/28/2022)   Exercise Vital Sign    Days of Exercise per Week: 7 days    Minutes of Exercise per Session: 50 min  Stress: No Stress Concern Present (09/28/2022)   [place]    Feeling of Stress : Not at all  Social Connections: [place] (09/28/2022)   Social Connection and Isolation Panel [NHANES]    Frequency of Communication with Friends and Family: More than three times a week    Frequency of Social Gatherings with Friends and Family: More than three times a week    Attends Religious Services: 1 to 4 times per year    Active Member of [employer] or Organizations: Yes    Attends Archivist Meetings: 1 to 4 times per year    Marital Status: Married  Human resources officer Violence: Not At Risk (09/28/2022)   Humiliation, Afraid, Rape, and Kick questionnaire    Fear of Current or Ex-Partner: No    Emotionally Abused: No    Physically Abused: No     Sexually Abused:  No   Social History   Tobacco Use  Smoking Status Former   Packs/day: 0.25   Years: 3.00   Total pack years: 0.75   Types: Cigarettes  Smokeless Tobacco Never  Tobacco Comments   quit when i was in the service   Social History   Substance and Sexual Activity  Alcohol Use Yes   Alcohol/week: 2.0 - 3.0 standard drinks of alcohol   Types: 2 - 3 Cans of beer per week   Comment: socially    Family History:  Family History  Problem Relation Age of Onset   Breast cancer Maternal Aunt    Diabetes Maternal Uncle    Heart disease Maternal Uncle    Breast cancer Maternal Grandmother    Pancreatic cancer Maternal Grandmother    Cancer Maternal Grandmother    Prostate cancer Cousin    Stomach cancer Paternal Aunt    Depression Mother    Early death Mother    Heart attack Mother    Cancer Father    COPD Father    Hearing loss Father    Arthritis Brother    Alcohol abuse Daughter    Depression Daughter    Diabetes Daughter    COPD Paternal Grandmother    Hearing loss Brother    Heart attack Brother    Alcohol abuse Brother    Asthma Brother    COPD Brother    Depression Brother    Colon cancer Neg Hx    Celiac disease Neg Hx    Cirrhosis Neg Hx    Clotting disorder Neg Hx    Colitis Neg Hx    Colon polyps Neg Hx    Crohn's disease Neg Hx    Cystic fibrosis Neg Hx    Esophageal cancer Neg Hx    Hemochromatosis Neg Hx    Inflammatory bowel disease Neg Hx    Irritable bowel syndrome Neg Hx    Kidney disease Neg Hx    Liver cancer Neg Hx    Liver disease Neg Hx    Ovarian cancer Neg Hx    Rectal cancer Neg Hx    Ulcerative colitis Neg Hx    Uterine cancer Neg Hx    Wilson's disease Neg Hx     Past medical history, surgical history, medications, allergies, family history and social history reviewed with patient today and changes made to appropriate areas of the chart.   ROS All other ROS negative except what is listed above and in the HPI.       Objective:    BP 128/80 (BP Location: Left Arm, Patient Position: Sitting, Cuff Size: Normal)   Pulse 67   Temp (!) 97.4 F (36.3 C)   Ht 5' 9.5" (1.765 m)   Wt 164 lb (74.4 kg)   SpO2 98%   BMI 23.87 kg/m   Wt Readings from Last 3 Encounters:  10/31/22 164 lb (74.4 kg)  09/28/22 165 lb (74.8 kg)  05/17/22 166 lb (75.3 kg)    Hearing Screening  Method: Audiometry   '500Hz'$  '1000Hz'$  '2000Hz'$  '3000Hz'$  '4000Hz'$   Right ear '25 25 25 25 25  '$ Left ear '20 20 20 '$ - 40   Vision Screening   Right eye Left eye Both eyes  Without correction     With correction '20/35 20/35 20/35 '$    Physical Exam     09/28/2022    9:11 AM  6CIT Screen  What Year? 0 points  What month? 0 points  What time? 0 points  Count back from 20 0 points  Months in reverse 0 points  Repeat phrase 0 points  Total Score 0 points    Cognitive Testing - 6-CIT  Correct? Score   What year is it? yes 4 Yes = 0    No = 4  What month is it? yes 4 Yes = 0    No = 3  Remember:     Pia Mau, [address], [place]     What time is it? yes 4 Yes = 0    No = 3  Count backwards from 20 to 1 yes 4 Correct = 0    1 error = 2   More than 1 error = 4  Say the months of the year in reverse. yes 4 Correct = 0    1 error = 2   More than 1 error = 4  What address did I ask you to remember? yes 10 Correct = 0  1 error = 2    2 error = 4    3 error = 6    4 error = 8    All wrong = 10       TOTAL SCORE  30/28   Interpretation:  Normal  Normal (0-7) Abnormal (8-28)    Results for orders placed or performed during the hospital encounter of 02/23/22  Surgical pathology  Result Value Ref Range   SURGICAL PATHOLOGY      SURGICAL PATHOLOGY CASE: [id number] PATIENT: Woods Maybee Surgical Pathology Report     Clinical History: Recurrent left inguinal hernia, bladder diverticulum (crm)     FINAL MICROSCOPIC DIAGNOSIS:  A. BLADDER WALL, DIVERTICULUM, EXCISION: Diverticulum wall with benign urothelium, submucosa  and muscularis  d GROSS DESCRIPTION:  Received fresh is a 5.2 x 5.0 x 0.7 cm disrupted portion of pink-tan, membranous, saccular soft tissue.  The external surface is smooth with multiple transmural disruptions.  The inner surface is pink-tan and smooth without identifiable excrescences.  The wall has a thickness of 0.1 cm.  Representative sections are submitted in 1 block. Amparo Bristol, 02/23/2022)    Final Diagnosis performed by Casimer Lanius, MD.   Electronically signed 02/26/2022 Technical and / or Professional components performed at [facility], [place] [address], [place], [place] [address].  Immunohistochemistry Technical component (if applicable) was  performed at [facility]. [address] [place], [place], [place] [address].   IMMUNOHISTOCHEMISTRY DISCLAIMER (if applicable): Some of these immunohistochemical stains may have been developed and the performance characteristics determine by [facility]. Some may not have been cleared or approved by the U.S. Food and Drug Administration. The FDA has determined that such clearance or approval is not necessary. This test is used for clinical purposes. It should not be regarded as investigational or for research. This laboratory is certified under the [place] (CLIA-88) as qualified to perform high complexity clinical laboratory testing.  The controls stained appropriately.       Assessment & Plan:   Problem List Items Addressed This Visit   None Visit Diagnoses     History of tobacco use    -  Primary   Relevant Orders   Ambulatory Referral Lung Cancer Screening [facility]        Preventative Services:  Health Risk Assessment and Personalized Prevention Plan: Bone Mass Measurements: CVD Screening:  Colon Cancer Screening:  Depression Screening:  Diabetes Screening:  Glaucoma Screening:  Hepatitis B vaccine: Hepatitis  C  screening:  HIV Screening: Flu Vaccine: Lung cancer Screening: Obesity Screening:  Pneumonia Vaccines (2): STI Screening: PSA screening:  Discussed aspirin prophylaxis for myocardial infarction prevention and decision was it was not indicated  LABORATORY TESTING:  Health maintenance labs ordered today as discussed above.   The natural history of prostate cancer and ongoing controversy regarding screening and potential treatment outcomes of prostate cancer has been discussed with the patient. The meaning of a false positive PSA and a false negative PSA has been discussed. He indicates understanding of the limitations of this screening test and wishes ** to proceed with screening PSA testing.   IMMUNIZATIONS:   - Tdap: Tetanus vaccination status reviewed: last tetanus booster within 10 years. - Influenza:  will have tomorrow - Pneumovax: Up to date - Prevnar: Up to date - Zostavax vaccine:  advised to have at pharmacy  SCREENING: - Colonoscopy: Up to date  Discussed with patient purpose of the colonoscopy is to detect colon cancer at curable precancerous or early stages   - AAA Screening: Up to date  -Hearing Test: Ordered today  -Spirometry: Refused   PATIENT COUNSELING:    Sexuality: Discussed sexually transmitted diseases, partner selection, use of condoms, avoidance of unintended pregnancy  and contraceptive alternatives.   Advised to avoid cigarette smoking.  I discussed with the patient that most people either abstain from alcohol or drink within safe limits (<=14/week and <=4 drinks/occasion for males, <=7/weeks and <= 3 drinks/occasion for females) and that the risk for alcohol disorders and other health effects rises proportionally with the number of drinks per week and how often a drinker exceeds daily limits.  Discussed cessation/primary prevention of drug use and availability of treatment for abuse.   Diet: Encouraged to adjust caloric intake to maintain  or  achieve ideal body weight, to reduce intake of dietary saturated fat and total fat, to limit sodium intake by avoiding high sodium foods and not adding table salt, and to maintain adequate dietary potassium and calcium preferably from fresh fruits, vegetables, and low-fat dairy products.    stressed the importance of regular exercise  Injury prevention: Discussed safety belts, safety helmets, smoke detector, smoking near bedding or upholstery.   Dental health: Discussed importance of regular tooth brushing, flossing, and dental visits.   Follow up plan: NEXT PREVENTATIVE PHYSICAL DUE IN 1 YEAR. Return Follow-up for next scheduled exam..  Have ordered consultation for screening CT of chest.  He will have his flu shot and zoster vaccines through his pharmacy.

## 2022-11-16 ENCOUNTER — Ambulatory Visit: Payer: Medicare Other | Admitting: Family Medicine

## 2022-11-24 ENCOUNTER — Other Ambulatory Visit: Payer: Self-pay | Admitting: Family Medicine

## 2022-11-24 DIAGNOSIS — E782 Mixed hyperlipidemia: Secondary | ICD-10-CM

## 2022-12-14 ENCOUNTER — Telehealth: Payer: Self-pay

## 2022-12-14 NOTE — Telephone Encounter (Signed)
Returned call to patient regarding referral to Lung cancer screening.  Patient was ineligible for the LDCT due to he has been a non-smoker for 40-50 years.  He was also concerned about second hand smoke within the past 15 years.  Currently the LCS program does not accept patients who have quit smoking over the 15 years and second hand smoke does not qualify him either.  Advised we will message Dr. Ethelene Hal regarding this, as patient is very interested in completing a CT of the chest.  Dr. Ethelene Hal can advise if CT chest wo would be an alternative to LDCT.  Route message to Dr. Ethelene Hal.  Patient acknowledged understanding.  Referral to LCS closed

## 2022-12-19 DIAGNOSIS — R972 Elevated prostate specific antigen [PSA]: Secondary | ICD-10-CM | POA: Diagnosis not present

## 2022-12-26 DIAGNOSIS — R972 Elevated prostate specific antigen [PSA]: Secondary | ICD-10-CM | POA: Diagnosis not present

## 2023-01-01 ENCOUNTER — Other Ambulatory Visit: Payer: Self-pay | Admitting: Urology

## 2023-01-01 DIAGNOSIS — R972 Elevated prostate specific antigen [PSA]: Secondary | ICD-10-CM

## 2023-01-30 ENCOUNTER — Ambulatory Visit
Admission: RE | Admit: 2023-01-30 | Discharge: 2023-01-30 | Disposition: A | Discharge status: Home / Self Care | Payer: Medicare Other | Source: Ambulatory Visit | Attending: Urology | Admitting: Urology

## 2023-01-30 DIAGNOSIS — R972 Elevated prostate specific antigen [PSA]: Secondary | ICD-10-CM | POA: Diagnosis not present

## 2023-01-30 MED ORDER — GADOPICLENOL 0.5 MMOL/ML IV SOLN
8.0000 mL | Freq: Once | INTRAVENOUS | Status: AC | PRN
  Administered 2023-01-30: 8 mL via INTRAVENOUS

## 2023-06-12 DIAGNOSIS — R972 Elevated prostate specific antigen [PSA]: Secondary | ICD-10-CM | POA: Diagnosis not present

## 2023-06-15 IMAGING — CT CT ABD-PELV W/O CM
2 of 4 series · 15 of 46 positions shown, 17 images · non-contrast
Comparison: CT abdomen/pelvis 05/08/2007

CLINICAL DATA: Left lower quadrant pain, concern for incarcerated
inguinal hernia

EXAM:
CT ABDOMEN AND PELVIS WITHOUT CONTRAST
TECHNIQUE: Multidetector CT imaging of the abdomen and pelvis was performed
following the standard protocol without IV contrast.

[Series 2: axial st · axial · 0.89mm/px · z∈[-574,-149]mm · 12 of 93 slices shown, 14 images]
[im 4/93  soft-tissue]
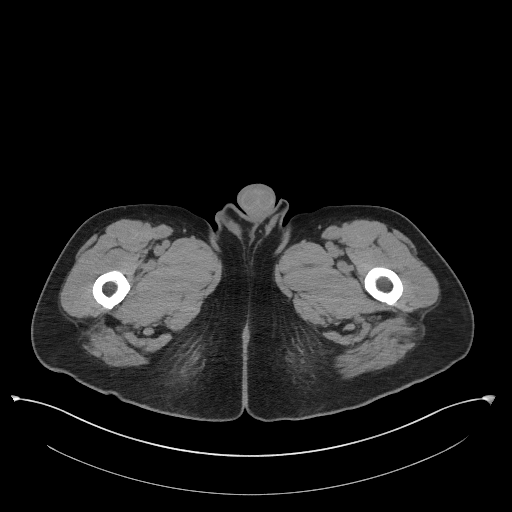
[im 4/93  bone]
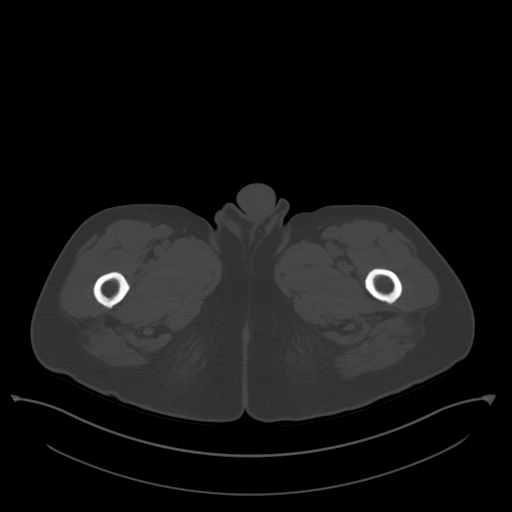
[im 12/93  soft-tissue]
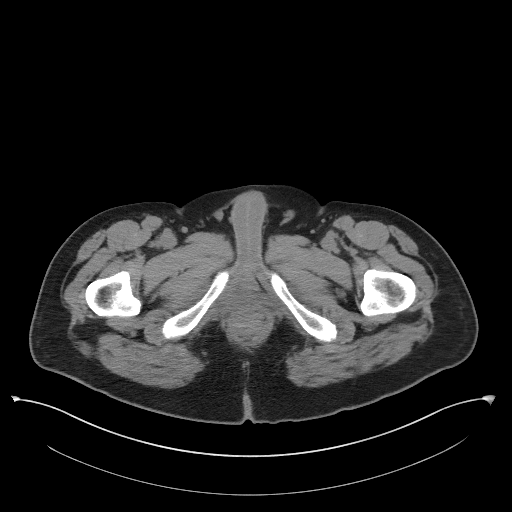
[im 20/93  soft-tissue]
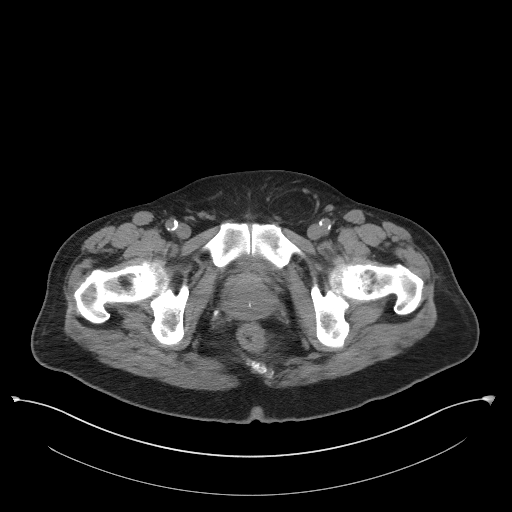
[im 27/93  soft-tissue]
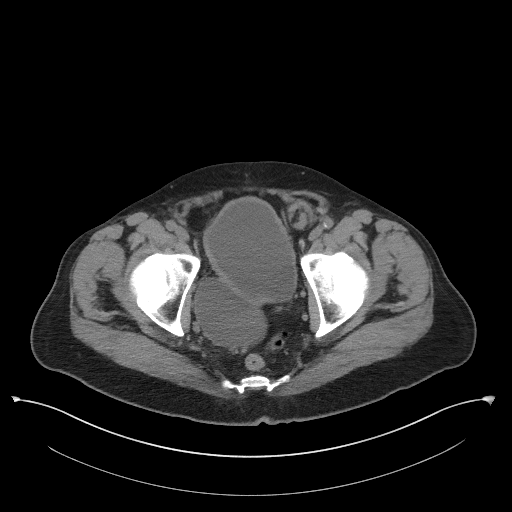
[im 35/93  soft-tissue]
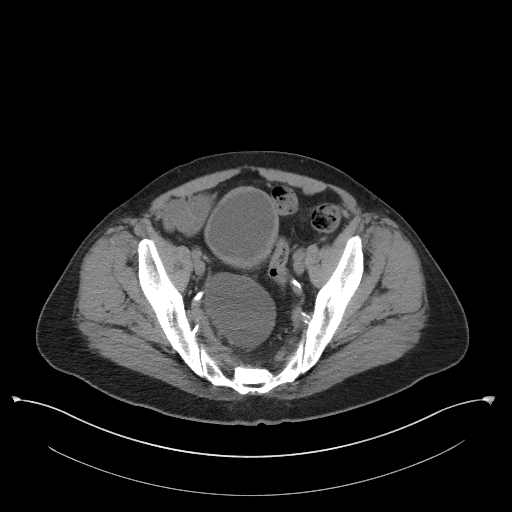
[im 43/93  soft-tissue]
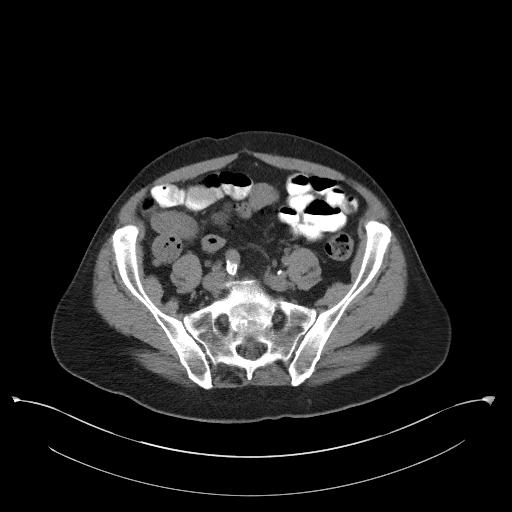
[im 50/93  soft-tissue]
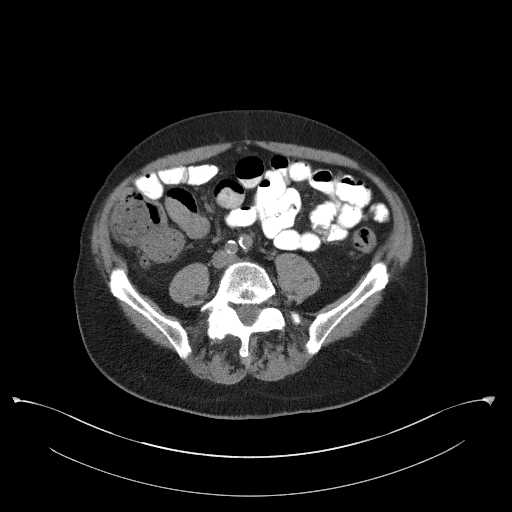
[im 58/93  soft-tissue]
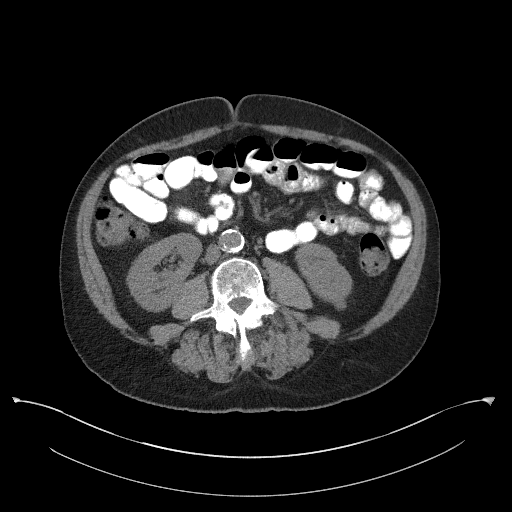
[im 66/93  soft-tissue]
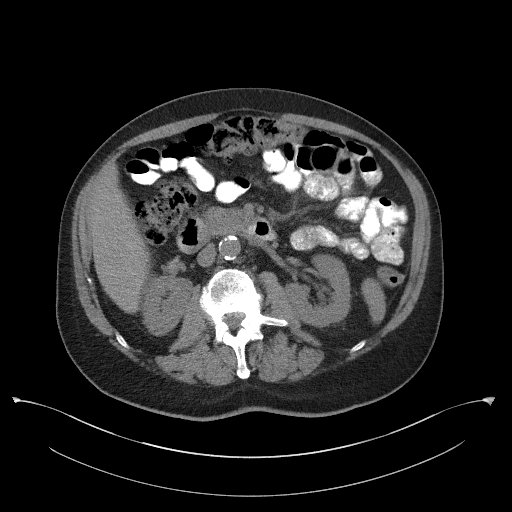
[im 66/93  bone]
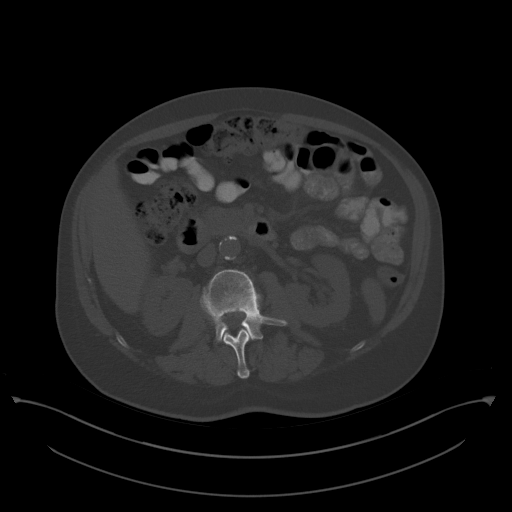
[im 73/93  soft-tissue]
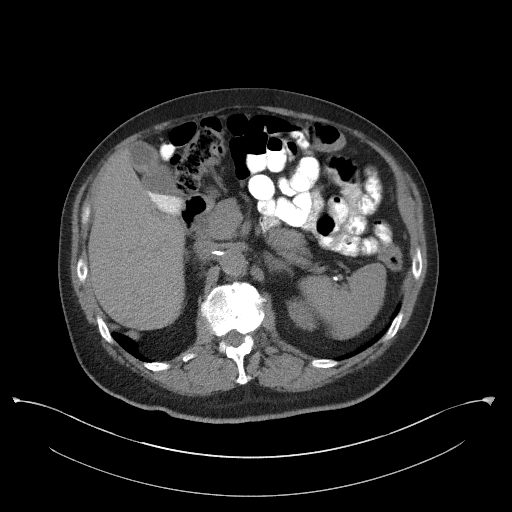
[im 81/93  soft-tissue]
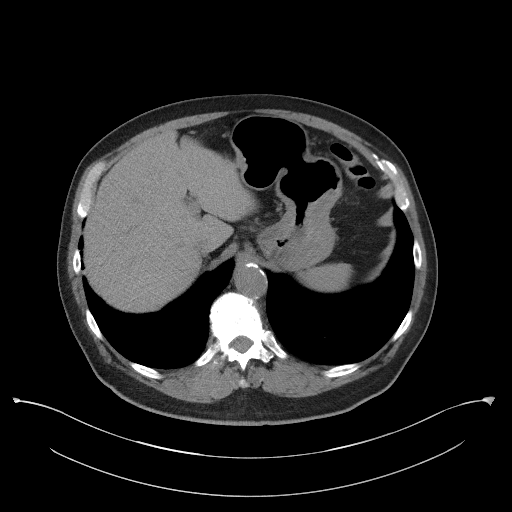
[im 89/93  soft-tissue]
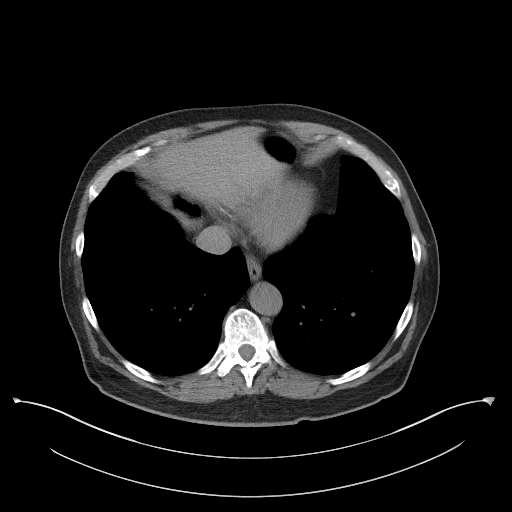

[Series 5: coronal st · coronal · 0.75mm/px · 3 of 101 slices shown]
[im 34/101  soft-tissue]
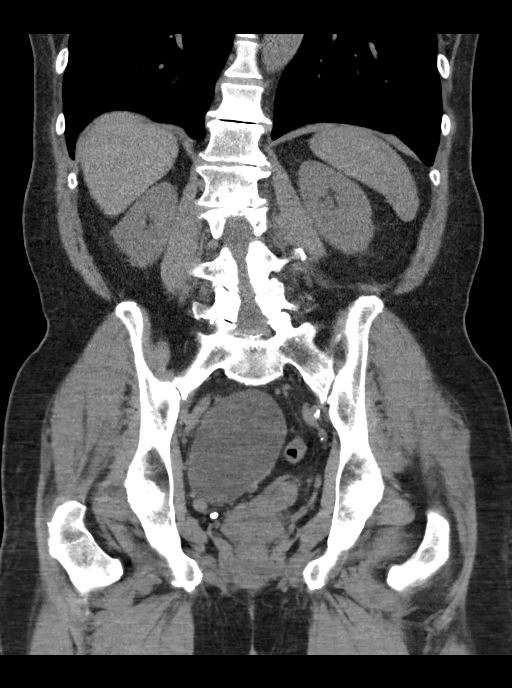
[im 45/101  soft-tissue]
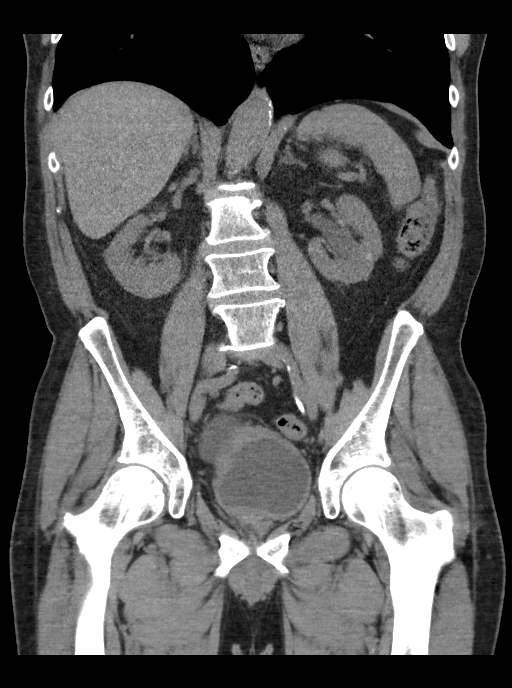
[im 56/101  soft-tissue]
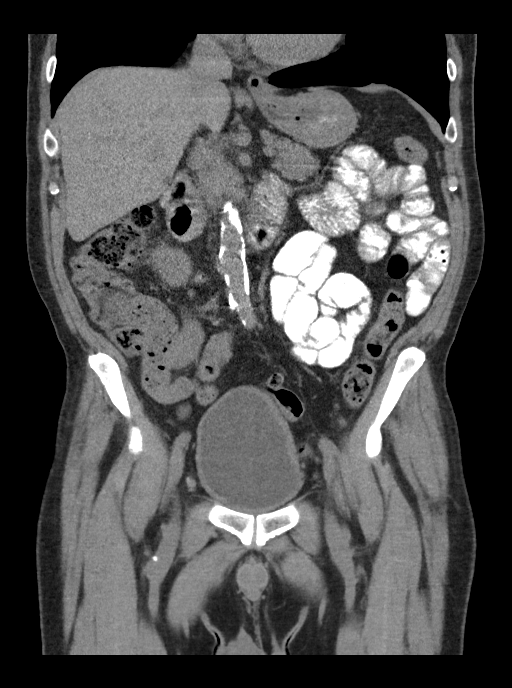

[15 of 46 positions shown; findings below may reference images not displayed]

FINDINGS: Lower chest: The lung bases are clear. The imaged heart is
unremarkable.

Hepatobiliary: Layering hyperdense material is seen in the
gallbladder which may reflect small stones, present in 4556. The
liver is unremarkable, within the confines of noncontrast technique.
There is no biliary ductal dilatation.

Pancreas: Unremarkable.

Spleen: Unremarkable.

Adrenals/Urinary Tract: The adrenals are unremarkable.

There is a subcentimeter hypodense exophytic lesion arising from the
left lower pole, most likely a small cyst. There are no other focal
lesions, within the confines of noncontrast technique. There are no
stones. There is no hydronephrosis or hydroureter.

There is a large right posterolateral bladder diverticulum measuring
up to 7.4 cm. The bladder wall is mildly thickened diffusely.

Stomach/Bowel: The stomach is unremarkable. There is no evidence of
bowel obstruction. There is no abnormal bowel wall thickening or
inflammatory change. There are scattered colonic diverticula without
evidence of acute diverticulitis. Appendix is normal.

Vascular/Lymphatic: There is scattered calcified atherosclerotic
plaque throughout the nonaneurysmal abdominal aorta. There is no
abdominal or pelvic lymphadenopathy.

Reproductive: The prostate is prominent measuring up to 4.9 cm
transverse. The seminal vesicles are unremarkable.

Other: There is no ascites or free air. There is a fat containing
left inguinal hernia without evidence of strangulation. A loop of
proximal sigmoid colon comes in close proximity to the hernia
without frank herniation.

Musculoskeletal: There is transverse lucency through the sacrum at
the level of S3-S4 suspicious for subacute fracture. There is no
other acute osseous abnormality.
IMPRESSION: 1. Fat containing left inguinal hernia without evidence of
strangulation. A loop of large bowel comes in close proximity to the
hernia without frank herniation.
2. Large right posterolateral bladder diverticulum measuring up to
7.4 cm and diffuse bladder wall thickening suggesting chronic outlet
obstruction in the setting of mild prostatomegaly.
3. Cholelithiasis without evidence of acute cholecystitis.
4. Diverticulosis without evidence of acute diverticulitis.
5. Suspect subacute fracture of the sacrum at S3-S4. Correlate with
symptoms, and consider MRI to evaluate for acuity as indicated.

Aortic Atherosclerosis (ALAEX-K2L.L).

## 2023-06-19 DIAGNOSIS — R972 Elevated prostate specific antigen [PSA]: Secondary | ICD-10-CM | POA: Diagnosis not present

## 2023-08-07 ENCOUNTER — Other Ambulatory Visit: Payer: Self-pay | Admitting: Family Medicine

## 2023-08-07 DIAGNOSIS — I10 Essential (primary) hypertension: Secondary | ICD-10-CM

## 2023-08-09 LAB — HM DIABETES EYE EXAM

## 2023-12-27 ENCOUNTER — Encounter: Payer: Self-pay | Admitting: Family Medicine

## 2023-12-27 ENCOUNTER — Ambulatory Visit: Payer: Medicare Other | Admitting: Family Medicine

## 2023-12-27 VITALS — BP 114/76 | HR 76 | Temp 97.2°F | Ht 69.0 in | Wt 156.2 lb

## 2023-12-27 DIAGNOSIS — J069 Acute upper respiratory infection, unspecified: Secondary | ICD-10-CM | POA: Diagnosis not present

## 2023-12-27 DIAGNOSIS — I10 Essential (primary) hypertension: Secondary | ICD-10-CM | POA: Diagnosis not present

## 2023-12-27 DIAGNOSIS — E782 Mixed hyperlipidemia: Secondary | ICD-10-CM

## 2023-12-27 DIAGNOSIS — Z659 Problem related to unspecified psychosocial circumstances: Secondary | ICD-10-CM | POA: Diagnosis not present

## 2023-12-27 LAB — COMPREHENSIVE METABOLIC PANEL
ALT: 29 U/L (ref 0–53)
AST: 26 U/L (ref 0–37)
Albumin: 4.6 g/dL (ref 3.5–5.2)
Alkaline Phosphatase: 70 U/L (ref 39–117)
BUN: 16 mg/dL (ref 6–23)
CO2: 30 meq/L (ref 19–32)
Calcium: 9.7 mg/dL (ref 8.4–10.5)
Chloride: 99 meq/L (ref 96–112)
Creatinine, Ser: 0.85 mg/dL (ref 0.40–1.50)
GFR: 88.16 mL/min (ref >60.00)
Glucose, Bld: 106 mg/dL — ABNORMAL HIGH (ref 70–99)
Potassium: 4.1 meq/L (ref 3.5–5.1)
Sodium: 137 meq/L (ref 135–145)
Total Bilirubin: 0.6 mg/dL (ref 0.2–1.2)
Total Protein: 7.1 g/dL (ref 6.0–8.3)

## 2023-12-27 LAB — URINALYSIS, ROUTINE W REFLEX MICROSCOPIC
Bilirubin Urine: NEGATIVE
Hgb urine dipstick: NEGATIVE
Ketones, ur: NEGATIVE
Leukocytes,Ua: NEGATIVE
Nitrite: NEGATIVE
RBC / HPF: NONE SEEN (ref 0–?)
Specific Gravity, Urine: 1.02 (ref 1.000–1.030)
Total Protein, Urine: NEGATIVE
Urine Glucose: NEGATIVE
Urobilinogen, UA: 0.2 (ref 0.0–1.0)
WBC, UA: NONE SEEN (ref 0–?)
pH: 7 (ref 5.0–8.0)

## 2023-12-27 LAB — CBC
HCT: 43.5 % (ref 39.0–52.0)
Hemoglobin: 14.8 g/dL (ref 13.0–17.0)
MCHC: 34.1 g/dL (ref 30.0–36.0)
MCV: 96.4 fL (ref 78.0–100.0)
Platelets: 234 10*3/uL (ref 150.0–400.0)
RBC: 4.51 Mil/uL (ref 4.22–5.81)
RDW: 12.9 % (ref 11.5–15.5)
WBC: 7.6 10*3/uL (ref 4.0–10.5)

## 2023-12-27 LAB — LDL CHOLESTEROL, DIRECT: Direct LDL: 63 mg/dL

## 2023-12-27 NOTE — Progress Notes (Signed)
Established Patient Office Visit   Subjective:  Patient ID: Collin Garza, male    DOB: [birth year]  Age: 73 y.o. MRN: [id number]  Chief Complaint  Patient presents with   Medical Management of Chronic Issues    Follow up. Pt is not fasting. Want Lung cancer screening due to long time exposure to 2nd hand smoke.     HPI Encounter Diagnoses  Name Primary?   Essential hypertension Yes   HYPERLIPIDEMIA    Other social stressor    Upper respiratory tract infection, unspecified type    For follow-up of above.  This has been a stressful time for him.  He is caring for his wife who is suffering from terminal liver cancer and is a palliative care as well as his daughter who has metastatic breast cancer.  They both live at home with him.  His mentally challenged grandson is now living on his own, has a job and just bought a car.  Resolving URI with a dry lingering cough.  Blood pressure well-controlled with amlodipine.  Continues with atorvastatin.   Review of Systems  Constitutional: Negative.   HENT: Negative.    Eyes:  Negative for blurred vision, discharge and redness.  Respiratory:  Positive for cough. Negative for sputum production, shortness of breath and wheezing.   Cardiovascular: Negative.   Gastrointestinal:  Negative for abdominal pain.  Genitourinary: Negative.   Musculoskeletal: Negative.  Negative for myalgias.  Skin:  Negative for rash.  Neurological:  Negative for tingling, loss of consciousness and weakness.  Endo/Heme/Allergies:  Negative for polydipsia.     Current Outpatient Medications:    amLODipine (NORVASC) 10 MG tablet, TAKE 1 TABLET(10 MG) BY MOUTH DAILY, Disp: 90 tablet, Rfl: 3   cholecalciferol (VITAMIN D) 25 MCG (1000 UNIT) tablet, Take 1,000 Units by mouth daily., Disp: , Rfl:    Omega-3 Fatty Acids (OMEGA 3 PO), Take 2 tablets by mouth daily. Dr. Hulda Marin, Disp: , Rfl:    atorvastatin (LIPITOR) 20 MG tablet, TAKE 1 TABLET(20 MG) BY MOUTH DAILY,  Disp: 90 tablet, Rfl: 1   calcium-vitamin D (OSCAL 500/200 D-3) 500-200 MG-UNIT tablet, Take 1 tablet by mouth 2 (two) times daily. (Patient taking differently: Take 2 tablets by mouth 2 (two) times daily.), Disp: 180 tablet, Rfl: 5   cyanocobalamin (VITAMIN B12) 1000 MCG tablet, Take 1,000 mcg by mouth daily., Disp: , Rfl:    OVER THE COUNTER MEDICATION, Take 2 tablets by mouth daily. Sinatra Prostate solution, Disp: , Rfl:    vitamin C (ASCORBIC ACID) 500 MG tablet, Take 500-1,000 mg by mouth daily., Disp: , Rfl:    Objective:     BP 114/76   Pulse 76   Temp (!) 97.2 F (36.2 C)   Ht 5\' 9"  (1.753 m)   Wt 156 lb 3.2 oz (70.9 kg)   SpO2 95%   BMI 23.07 kg/m    Physical Exam Constitutional:      General: He is not in acute distress.    Appearance: Normal appearance. He is not ill-appearing, toxic-appearing or diaphoretic.  HENT:     Head: Normocephalic and atraumatic.     Right Ear: External ear normal.     Left Ear: External ear normal.     Mouth/Throat:     Mouth: Mucous membranes are moist.     Pharynx: Oropharynx is clear. No oropharyngeal exudate or posterior oropharyngeal erythema.  Eyes:     General: No scleral icterus.  Right eye: No discharge.        Left eye: No discharge.     Extraocular Movements: Extraocular movements intact.     Conjunctiva/sclera: Conjunctivae normal.     Pupils: Pupils are equal, round, and reactive to light.  Cardiovascular:     Rate and Rhythm: Normal rate and regular rhythm.  Pulmonary:     Effort: Pulmonary effort is normal. No respiratory distress.     Breath sounds: Normal breath sounds. No wheezing, rhonchi or rales.  Abdominal:     General: Bowel sounds are normal.     Tenderness: There is no abdominal tenderness. There is no guarding or rebound.  Musculoskeletal:     Cervical back: No rigidity or tenderness.     Right lower leg: No edema.     Left lower leg: No edema.  Skin:    General: Skin is warm and dry.   Neurological:     Mental Status: He is alert and oriented to person, place, and time.  Psychiatric:        Mood and Affect: Mood normal.        Behavior: Behavior normal.      No results found for any visits on 12/27/23.    The 10-year ASCVD risk score (Arnett DK, et al., 2019) is: 14%    Assessment & Plan:   Essential hypertension -     CBC -     Comprehensive metabolic panel -     Urinalysis, Routine w reflex microscopic  HYPERLIPIDEMIA -     LDL cholesterol, direct  Other social stressor  Upper respiratory tract infection, unspecified type -     DG Chest 2 View; Future    Return in about 6 months (around 06/25/2024), or if symptoms worsen or fail to improve.    Mliss Sax, MD

## 2024-01-08 ENCOUNTER — Ambulatory Visit (INDEPENDENT_AMBULATORY_CARE_PROVIDER_SITE_OTHER)
Admission: RE | Admit: 2024-01-08 | Discharge: 2024-01-08 | Disposition: A | Discharge status: Home / Self Care | Payer: Medicare Other | Source: Ambulatory Visit | Attending: Family Medicine | Admitting: Family Medicine

## 2024-01-08 DIAGNOSIS — R918 Other nonspecific abnormal finding of lung field: Secondary | ICD-10-CM | POA: Diagnosis not present

## 2024-01-08 DIAGNOSIS — R972 Elevated prostate specific antigen [PSA]: Secondary | ICD-10-CM | POA: Diagnosis not present

## 2024-01-08 DIAGNOSIS — J069 Acute upper respiratory infection, unspecified: Secondary | ICD-10-CM

## 2024-01-08 DIAGNOSIS — I700 Atherosclerosis of aorta: Secondary | ICD-10-CM | POA: Diagnosis not present

## 2024-01-08 DIAGNOSIS — R0602 Shortness of breath: Secondary | ICD-10-CM | POA: Diagnosis not present

## 2024-01-21 ENCOUNTER — Encounter: Payer: Self-pay | Admitting: Family Medicine

## 2024-02-08 ENCOUNTER — Other Ambulatory Visit: Payer: Self-pay | Admitting: Family Medicine

## 2024-02-08 DIAGNOSIS — E782 Mixed hyperlipidemia: Secondary | ICD-10-CM

## 2024-06-09 ENCOUNTER — Ambulatory Visit (INDEPENDENT_AMBULATORY_CARE_PROVIDER_SITE_OTHER)

## 2024-06-09 VITALS — BP 124/64 | HR 67 | Temp 98.0°F | Ht 70.0 in | Wt 154.4 lb

## 2024-06-09 DIAGNOSIS — Z0000 Encounter for general adult medical examination without abnormal findings: Secondary | ICD-10-CM

## 2024-06-09 NOTE — Progress Notes (Signed)
 Subjective:   Collin Garza is a 73 y.o. who presents for a Medicare Wellness preventive visit.  As a reminder, Annual Wellness Visits don't include a physical exam, and some assessments may be limited, especially if this visit is performed virtually. We may recommend an in-person follow-up visit with your provider if needed.  Visit Complete: In person    Persons Participating in Visit: Patient.  AWV Questionnaire: No: Patient Medicare AWV questionnaire was not completed prior to this visit.  Cardiac Risk Factors include: advanced age (>38men, >56 women);dyslipidemia;hypertension;male gender     Objective:    Today's Vitals   06/09/24 0854  BP: 124/64  Pulse: 67  Temp: 98 F (36.7 C)  TempSrc: Oral  SpO2: 98%  Weight: 154 lb 6.4 oz (70 kg)  Height: 5' 10 (1.778 m)   Body mass index is 22.15 kg/m.     06/09/2024    9:00 AM 09/28/2022    9:10 AM 02/08/2022   10:10 AM 09/22/2015    1:54 PM  Advanced Directives  Does Patient Have a Medical Advance Directive? Yes Yes Yes Yes   Type of Estate agent of [place];Living will Living will Healthcare Power of [place];Living will Healthcare Power of [place];Living will   Does patient want to make changes to medical advance directive?  No - Patient declined  No - Patient declined   Copy of Healthcare Power of Attorney in Chart? No - copy requested  No - copy requested No - copy requested      Data saved with a previous flowsheet row definition    Current Medications (verified) Outpatient Encounter Medications as of 06/09/2024  Medication Sig   amLODipine  (NORVASC ) 10 MG tablet TAKE 1 TABLET(10 MG) BY MOUTH DAILY   atorvastatin  (LIPITOR) 20 MG tablet TAKE 1 TABLET(20 MG) BY MOUTH DAILY   calcium -vitamin D  (OSCAL 500/200 D-3) 500-200 MG-UNIT tablet Take 1 tablet by mouth 2 (two) times daily.   cholecalciferol (VITAMIN D ) 25 MCG (1000 UNIT) tablet Take 1,000 Units by mouth daily.   cyanocobalamin (VITAMIN  B12) 1000 MCG tablet Take 1,000 mcg by mouth daily.   Omega-3 Fatty Acids (OMEGA 3 PO) Take 2 tablets by mouth daily. Dr. beverly Jude   OVER THE COUNTER MEDICATION Take 2 tablets by mouth daily. Sinatra Prostate solution   vitamin C (ASCORBIC ACID) 500 MG tablet Take 500-1,000 mg by mouth daily.   No facility-administered encounter medications on file as of 06/09/2024.    Allergies (verified) Lisinopril, Other, Propoxyphene n-acetaminophen , and Penicillins   History: Past Medical History:  Diagnosis Date   Arthritis    LEFT HIP AND SHOULDER   Depression    Hypertension    Skin cancer    Past Surgical History:  Procedure Laterality Date   COLONOSCOPY     INGUINAL HERNIA REPAIR     x3   ROBOTIC ASSISTED LAPAROSCOPIC BLADDER DIVERTICULECTOMY N/A 02/23/2022   Procedure: XI ROBOTIC ASSISTED LAPAROSCOPIC PARTIAL CYSTECTOMY, FLEXIBLE CYSTOSCOPY;  Surgeon: Cam Morene ORN, MD;  Location: [facility];  Service: Urology;  Laterality: N/A;   SKIN CANCER EXCISION     Family History  Problem Relation Age of Onset   Breast cancer Maternal Aunt    Diabetes Maternal Uncle    Heart disease Maternal Uncle    Breast cancer Maternal Grandmother    Pancreatic cancer Maternal Grandmother    Cancer Maternal Grandmother    Prostate cancer Cousin    Stomach cancer Paternal Aunt    Depression Mother  Early death Mother    Heart attack Mother    Cancer Father    COPD Father    Hearing loss Father    Arthritis Brother    Alcohol abuse Daughter    Depression Daughter    Diabetes Daughter    COPD Paternal Grandmother    Hearing loss Brother    Heart attack Brother    Alcohol abuse Brother    Asthma Brother    COPD Brother    Depression Brother    Colon cancer Neg Hx    Celiac disease Neg Hx    Cirrhosis Neg Hx    Clotting disorder Neg Hx    Colitis Neg Hx    Colon polyps Neg Hx    Crohn's disease Neg Hx    Cystic fibrosis Neg Hx    Esophageal cancer Neg Hx    Hemochromatosis Neg Hx     Inflammatory bowel disease Neg Hx    Irritable bowel syndrome Neg Hx    Kidney disease Neg Hx    Liver cancer Neg Hx    Liver disease Neg Hx    Ovarian cancer Neg Hx    Rectal cancer Neg Hx    Ulcerative colitis Neg Hx    Uterine cancer Neg Hx    Wilson's disease Neg Hx    Social History   Socioeconomic History   Marital status: Married    Spouse name: Not on file   Number of children: Not on file   Years of education: Not on file   Highest education level: Associate degree: academic program  Occupational History   Not on file  Tobacco Use   Smoking status: Former    Current packs/day: 0.00    Average packs/day: 0.3 packs/day for 3.0 years (0.8 ttl pk-yrs)    Types: Cigarettes    Start date: 27    Quit date: 25    Years since quitting: 50.5    Passive exposure: Never   Smokeless tobacco: Never   Tobacco comments:    quit when i was in the service  Vaping Use   Vaping status: Never Used  Substance and Sexual Activity   Alcohol use: Yes    Alcohol/week: 2.0 - 3.0 standard drinks of alcohol    Types: 2 - 3 Cans of beer per week    Comment: socially   Drug use: No   Sexual activity: Yes  Other Topics Concern   Not on file  Social History Narrative   Not on file   Social Drivers of Health   Financial Resource Strain: Low Risk  (06/09/2024)   Overall Financial Resource Strain (CARDIA)    Difficulty of Paying Living Expenses: Not hard at all  Food Insecurity: No Food Insecurity (06/09/2024)   Hunger Vital Sign    Worried About Running Out of Food in the Last Year: Never true    Ran Out of Food in the Last Year: Never true  Transportation Needs: No Transportation Needs (06/09/2024)   PRAPARE - Administrator, Civil Service (Medical): No    Lack of Transportation (Non-Medical): No  Physical Activity: Sufficiently Active (06/09/2024)   Exercise Vital Sign    Days of Exercise per Week: 3 days    Minutes of Exercise per Session: 60 min  Stress: Stress  Concern Present (06/09/2024)   [employer] of Occupational Health - Occupational Stress Questionnaire    Feeling of Stress: To some extent  Social Connections: Moderately Isolated (06/09/2024)   Social  Connection and Isolation Panel    Frequency of Communication with Friends and Family: More than three times a week    Frequency of Social Gatherings with Friends and Family: More than three times a week    Attends Religious Services: Never    Database administrator or Organizations: No    Attends Engineer, structural: Never    Marital Status: Married    Tobacco Counseling Counseling given: Not Answered Tobacco comments: quit when i was in the service    Clinical Intake:  Pre-visit preparation completed: Yes  Pain : No/denies pain     Nutritional Status: BMI of 19-24  Normal Nutritional Risks: None Diabetes: No  Lab Results  Component Value Date   HGBA1C 5.3 06/17/2018   HGBA1C 5.2 05/13/2007     How often do you need to have someone help you when you read instructions, pamphlets, or other written materials from your doctor or pharmacy?: 1 - Never  Interpreter Needed?: No  Information entered by :: NAllen LPN   Activities of Daily Living     06/09/2024    8:55 AM  In your present state of health, do you have any difficulty performing the following activities:  Hearing? 0  Vision? 0  Difficulty concentrating or making decisions? 0  Walking or climbing stairs? 0  Dressing or bathing? 0  Doing errands, shopping? 0  Preparing Food and eating ? N  Using the Toilet? N  In the past six months, have you accidently leaked urine? N  Do you have problems with loss of bowel control? N  Managing your Medications? N  Managing your Finances? N  Housekeeping or managing your Housekeeping? N    Patient Care Team: Berneta Elsie Sayre, MD as PCP - General (Family Medicine)  I have updated your Care Teams any recent Medical Services you may have received from  other providers in the past year.     Assessment:   This is a routine wellness examination for [employer].  Hearing/Vision screen Hearing Screening - Comments:: Denies hearing issues Vision Screening - Comments:: Regular eye exams, [facility]   Goals Addressed   None    Depression Screen     06/09/2024    9:01 AM 10/31/2022    4:48 PM 09/28/2022    9:07 AM 05/17/2022    8:53 AM 02/14/2022    9:36 AM 05/24/2021    2:02 PM 05/17/2021   10:42 AM  PHQ 2/9 Scores  PHQ - 2 Score 4 1 0 0 0 0 1  PHQ- 9 Score 8 3     2     Fall Risk     06/09/2024    9:00 AM 10/31/2022    2:05 PM 09/28/2022    9:07 AM 05/17/2022    8:53 AM 02/14/2022    9:36 AM  Fall Risk   Falls in the past year? 0  0 0 0  Number falls in past yr: 0 0 0 0 0  Injury with Fall? 0 0 0    Risk for fall due to : Medication side effect  No Fall Risks    Follow up Falls evaluation completed;Falls prevention discussed  Falls evaluation completed        Data saved with a previous flowsheet row definition    MEDICARE RISK AT HOME:  Medicare Risk at Home Any stairs in or around the home?: Yes If so, are there any without handrails?: No Home free of loose throw rugs in walkways,  pet beds, electrical cords, etc?: Yes Adequate lighting in your home to reduce risk of falls?: Yes Life alert?: No Use of a cane, walker or w/c?: No Grab bars in the bathroom?: No Shower chair or bench in shower?: No Elevated toilet seat or a handicapped toilet?: No  TIMED UP AND GO:  Was the test performed?  Yes  Length of time to ambulate 10 feet: 5 sec Gait steady and fast without use of assistive device  Cognitive Function: 6CIT completed        06/09/2024    9:03 AM 09/28/2022    9:11 AM  6CIT Screen  What Year? 0 points 0 points  What month? 0 points 0 points  What time? 0 points 0 points  Count back from 20 0 points 0 points  Months in reverse 0 points 0 points  Repeat phrase 0 points 0 points  Total Score 0 points 0 points     Immunizations Immunization History  Administered Date(s) Administered   Influenza Inj Mdck Quad With Preservative 11/20/2017   Influenza, High Dose Seasonal PF 09/26/2018, 09/17/2019   Influenza-Unspecified 09/17/2019, 10/31/2021   PFIZER Comirnaty(Gray Top)Covid-19 Tri-Sucrose Vaccine 05/03/2021   PFIZER(Purple Top)SARS-COV-2 Vaccination 02/02/2020, 03/02/2020, 09/18/2020   Pfizer Covid-19 Vaccine Bivalent Booster 33yrs & up 12/07/2021   Pneumococcal Conjugate-13 01/05/2020   Pneumococcal Polysaccharide-23 12/25/2018   Td 09/07/2016    Screening Tests Health Maintenance  Topic Date Due   Zoster Vaccines- Shingrix (1 of 2) Never done   COVID-19 Vaccine (6 - 2024-25 season) 08/05/2023   INFLUENZA VACCINE  07/04/2024   Medicare Annual Wellness (AWV)  06/09/2025   Colonoscopy  12/14/2025   DTaP/Tdap/Td (2 - Tdap) 09/07/2026   Pneumococcal Vaccine: 50+ Years  Completed   Hepatitis C Screening  Completed   Hepatitis B Vaccines  Aged Out   HPV VACCINES  Aged Out   Meningococcal B Vaccine  Aged Out    Health Maintenance  Health Maintenance Due  Topic Date Due   Zoster Vaccines- Shingrix (1 of 2) Never done   COVID-19 Vaccine (6 - 2024-25 season) 08/05/2023   Health Maintenance Items Addressed: Due for shingles and covid vaccine  Additional Screening:  Vision Screening: Recommended annual ophthalmology exams for early detection of glaucoma and other disorders of the eye. Would you like a referral to an eye doctor? No    Dental Screening: Recommended annual dental exams for proper oral hygiene  Community Resource Referral / Chronic Care Management: CRR required this visit?  No   CCM required this visit?  No   Plan:    I have personally reviewed and noted the following in the patient's chart:   Medical and social history Use of alcohol, tobacco or illicit drugs  Current medications and supplements including opioid prescriptions. Patient is not currently taking  opioid prescriptions. Functional ability and status Nutritional status Physical activity Advanced directives List of other physicians Hospitalizations, surgeries, and ER visits in previous 12 months Vitals Screenings to include cognitive, depression, and falls Referrals and appointments  In addition, I have reviewed and discussed with patient certain preventive protocols, quality metrics, and best practice recommendations. A written personalized care plan for preventive services as well as general preventive health recommendations were provided to patient.   Ardella FORBES Dawn, LPN   01/05/7973   After Visit Summary: (In Person-Printed) AVS printed and given to the patient  Notes: Nothing significant to report at this time.

## 2024-06-09 NOTE — Patient Instructions (Signed)
 Collin Garza , Thank you for taking time out of your busy schedule to complete your Annual Wellness Visit with me. I enjoyed our conversation and look forward to speaking with you again next year. I, as well as your care team,  appreciate your ongoing commitment to your health goals. Please review the following plan we discussed and let me know if I can assist you in the future. Your Game plan/ To Do List    Referrals: If you haven't heard from the office you've been referred to, please reach out to them at the phone provided.  N/a Follow up Visits: Next Medicare AWV with our clinical staff:   06/15/2025 at 8:50 Have you seen your provider in the last 6 months (3 months if uncontrolled diabetes)? Yes Next Office Visit with your provider: 07/10/2024 at 8:20  Clinician Recommendations:  Aim for 30 minutes of exercise or brisk walking, 6-8 glasses of water , and 5 servings of fruits and vegetables each day.       This is a list of the screening recommended for you and due dates:  Health Maintenance  Topic Date Due   Zoster (Shingles) Vaccine (1 of 2) Never done   COVID-19 Vaccine (6 - 2024-25 season) 08/05/2023   Flu Shot  07/04/2024   Medicare Annual Wellness Visit  06/09/2025   Colon Cancer Screening  12/14/2025   DTaP/Tdap/Td vaccine (2 - Tdap) 09/07/2026   Pneumococcal Vaccine for age over 36  Completed   Hepatitis C Screening  Completed   Hepatitis B Vaccine  Aged Out   HPV Vaccine  Aged Out   Meningitis B Vaccine  Aged Out    Advanced directives: (Copy Requested) Please bring a copy of your health care power of attorney and living will to the office to be added to your chart at your convenience. You can mail to [facility] [address] [place], [place] [address] or email to [email or web address] Advance Care Planning is important because it:  [x]  Makes sure you receive the medical care that is consistent with your values, goals, and preferences  [x]  It  provides guidance to your family and loved ones and reduces their decisional burden about whether or not they are making the right decisions based on your wishes.  Follow the link provided in your after visit summary or read over the paperwork we have mailed to you to help you started getting your Advance Directives in place. If you need assistance in completing these, please reach out to us  so that we can help you!  See attachments for Preventive Care and Fall Prevention Tips.

## 2024-06-26 ENCOUNTER — Ambulatory Visit: Payer: Medicare Other | Admitting: Family Medicine

## 2024-07-08 DIAGNOSIS — R972 Elevated prostate specific antigen [PSA]: Secondary | ICD-10-CM | POA: Diagnosis not present

## 2024-07-10 ENCOUNTER — Encounter: Payer: Self-pay | Admitting: Family Medicine

## 2024-07-10 ENCOUNTER — Ambulatory Visit (INDEPENDENT_AMBULATORY_CARE_PROVIDER_SITE_OTHER): Admitting: Family Medicine

## 2024-07-10 VITALS — BP 118/68 | HR 68 | Temp 97.5°F | Ht 70.0 in | Wt 153.6 lb

## 2024-07-10 DIAGNOSIS — E782 Mixed hyperlipidemia: Secondary | ICD-10-CM | POA: Diagnosis not present

## 2024-07-10 DIAGNOSIS — F4321 Adjustment disorder with depressed mood: Secondary | ICD-10-CM | POA: Diagnosis not present

## 2024-07-10 DIAGNOSIS — Z659 Problem related to unspecified psychosocial circumstances: Secondary | ICD-10-CM

## 2024-07-10 DIAGNOSIS — I10 Essential (primary) hypertension: Secondary | ICD-10-CM

## 2024-07-10 DIAGNOSIS — Z87898 Personal history of other specified conditions: Secondary | ICD-10-CM | POA: Diagnosis not present

## 2024-07-10 LAB — URINALYSIS, ROUTINE W REFLEX MICROSCOPIC
Bilirubin Urine: NEGATIVE
Hgb urine dipstick: NEGATIVE
Ketones, ur: NEGATIVE
Leukocytes,Ua: NEGATIVE
Nitrite: NEGATIVE
Specific Gravity, Urine: 1.02 (ref 1.000–1.030)
Total Protein, Urine: NEGATIVE
Urine Glucose: NEGATIVE
Urobilinogen, UA: 0.2 (ref 0.0–1.0)
WBC, UA: NONE SEEN — AB (ref 0–?)
pH: 6 (ref 5.0–8.0)

## 2024-07-10 NOTE — Progress Notes (Signed)
 Established Patient Office Visit   Subjective:  Patient ID: Collin Garza, male    DOB: [birth year]  Age: 73 y.o. MRN: [id number]  Chief Complaint  Patient presents with   Medical Management of Chronic Issues    6 month follow up. Pt is fasting.     HPI Encounter Diagnoses  Name Primary?   Grieving Yes   Essential hypertension    HYPERLIPIDEMIA    Other social stressor    History of elevated PSA    For follow-up of above.  His wife passed this past month from cancer involving her bile duct.  He is hanging in there.  His daughter battling metastatic breast cancer continues to live with him.  His grandson who considered patient's wife to be his mom is doing okay.  Patient continues to stay active and remains busy making final arrangements.   Review of Systems  Constitutional: Negative.   HENT: Negative.    Eyes:  Negative for blurred vision, discharge and redness.  Respiratory: Negative.    Cardiovascular: Negative.   Gastrointestinal:  Negative for abdominal pain.  Genitourinary: Negative.   Musculoskeletal: Negative.  Negative for myalgias.  Skin:  Negative for rash.  Neurological:  Negative for tingling, loss of consciousness and weakness.  Endo/Heme/Allergies:  Negative for polydipsia.     Current Outpatient Medications:    amLODipine  (NORVASC ) 10 MG tablet, TAKE 1 TABLET(10 MG) BY MOUTH DAILY, Disp: 90 tablet, Rfl: 3   atorvastatin  (LIPITOR) 20 MG tablet, TAKE 1 TABLET(20 MG) BY MOUTH DAILY, Disp: 90 tablet, Rfl: 1   calcium -vitamin D  (OSCAL 500/200 D-3) 500-200 MG-UNIT tablet, Take 1 tablet by mouth 2 (two) times daily., Disp: 180 tablet, Rfl: 5   cholecalciferol (VITAMIN D ) 25 MCG (1000 UNIT) tablet, Take 1,000 Units by mouth daily., Disp: , Rfl:    cyanocobalamin (VITAMIN B12) 1000 MCG tablet, Take 1,000 mcg by mouth daily., Disp: , Rfl:    Omega-3 Fatty Acids (OMEGA 3 PO), Take 2 tablets by mouth daily. Dr. beverly Jude, Disp: , Rfl:    vitamin C (ASCORBIC ACID)  500 MG tablet, Take 500-1,000 mg by mouth daily., Disp: , Rfl:    Objective:     BP 118/68 (BP Location: Right Arm, Patient Position: Sitting, Cuff Size: Normal)   Pulse 68   Temp (!) 97.5 F (36.4 C) (Temporal)   Ht 5' 10 (1.778 m)   Wt 153 lb 9.6 oz (69.7 kg)   SpO2 98%   BMI 22.04 kg/m  BP Readings from Last 3 Encounters:  07/10/24 118/68  06/09/24 124/64  12/27/23 114/76   Wt Readings from Last 3 Encounters:  07/10/24 153 lb 9.6 oz (69.7 kg)  06/09/24 154 lb 6.4 oz (70 kg)  12/27/23 156 lb 3.2 oz (70.9 kg)      Physical Exam Constitutional:      General: He is not in acute distress.    Appearance: Normal appearance. He is not ill-appearing, toxic-appearing or diaphoretic.  HENT:     Head: Normocephalic and atraumatic.     Right Ear: External ear normal.     Left Ear: External ear normal.  Eyes:     General: No scleral icterus.       Right eye: No discharge.        Left eye: No discharge.     Extraocular Movements: Extraocular movements intact.     Conjunctiva/sclera: Conjunctivae normal.  Pulmonary:     Effort: Pulmonary effort is normal. No respiratory distress.  Skin:    General: Skin is warm and dry.  Neurological:     Mental Status: He is alert and oriented to person, place, and time.  Psychiatric:        Mood and Affect: Mood normal.        Behavior: Behavior normal.      No results found for any visits on 07/10/24.    The 10-year ASCVD risk score (Arnett DK, et al., 2019) is: 14.8%    Assessment & Plan:   Grieving  Essential hypertension -     Comprehensive metabolic panel with GFR -     Urinalysis, Routine w reflex microscopic  HYPERLIPIDEMIA -     Comprehensive metabolic panel with GFR -     Lipid panel  Other social stressor  History of elevated PSA -     PSA    Return in about 6 months (around 01/10/2025), or if symptoms worsen or fail to improve.    Elsie Sim Lent, MD

## 2024-07-16 ENCOUNTER — Other Ambulatory Visit

## 2024-07-22 ENCOUNTER — Other Ambulatory Visit (INDEPENDENT_AMBULATORY_CARE_PROVIDER_SITE_OTHER)

## 2024-07-22 DIAGNOSIS — E782 Mixed hyperlipidemia: Secondary | ICD-10-CM

## 2024-07-22 LAB — COMPREHENSIVE METABOLIC PANEL WITH GFR
ALT: 20 U/L (ref 0–53)
AST: 21 U/L (ref 0–37)
Albumin: 4.4 g/dL (ref 3.5–5.2)
Alkaline Phosphatase: 56 U/L (ref 39–117)
BUN: 13 mg/dL (ref 6–23)
CO2: 25 meq/L (ref 19–32)
Calcium: 9.2 mg/dL (ref 8.4–10.5)
Chloride: 99 meq/L (ref 96–112)
Creatinine, Ser: 0.7 mg/dL (ref 0.40–1.50)
GFR: 93.11 mL/min (ref >60.00)
Glucose, Bld: 102 mg/dL — ABNORMAL HIGH (ref 70–99)
Potassium: 4.1 meq/L (ref 3.5–5.1)
Sodium: 135 meq/L (ref 135–145)
Total Bilirubin: 1 mg/dL (ref 0.2–1.2)
Total Protein: 6.7 g/dL (ref 6.0–8.3)

## 2024-07-22 LAB — LIPID PANEL
Cholesterol: 177 mg/dL (ref 0–200)
HDL: 104.8 mg/dL (ref >39.00)
LDL Cholesterol: 60 mg/dL (ref 0–99)
NonHDL: 71.85
Total CHOL/HDL Ratio: 2
Triglycerides: 61 mg/dL (ref 0.0–149.0)
VLDL: 12.2 mg/dL (ref 0.0–40.0)

## 2024-07-22 LAB — PSA: PSA: 6.01 ng/mL — ABNORMAL HIGH (ref 0.10–4.00)

## 2024-07-22 NOTE — Addendum Note (Signed)
 Addended by: ALTO PARODY D on: 07/22/2024 08:45 AM   Modules accepted: Orders

## 2024-07-24 ENCOUNTER — Ambulatory Visit: Payer: Self-pay | Admitting: Family Medicine

## 2024-08-12 ENCOUNTER — Other Ambulatory Visit: Payer: Self-pay | Admitting: Family Medicine

## 2024-08-12 DIAGNOSIS — I10 Essential (primary) hypertension: Secondary | ICD-10-CM

## 2024-08-12 DIAGNOSIS — E782 Mixed hyperlipidemia: Secondary | ICD-10-CM

## 2024-09-17 ENCOUNTER — Telehealth: Payer: Self-pay | Admitting: Family Medicine

## 2024-09-17 ENCOUNTER — Ambulatory Visit (INDEPENDENT_AMBULATORY_CARE_PROVIDER_SITE_OTHER): Admitting: Dermatology

## 2024-09-17 ENCOUNTER — Encounter: Payer: Self-pay | Admitting: Dermatology

## 2024-09-17 VITALS — BP 128/71 | HR 62

## 2024-09-17 DIAGNOSIS — W908XXA Exposure to other nonionizing radiation, initial encounter: Secondary | ICD-10-CM

## 2024-09-17 DIAGNOSIS — D2371 Other benign neoplasm of skin of right lower limb, including hip: Secondary | ICD-10-CM

## 2024-09-17 DIAGNOSIS — D229 Melanocytic nevi, unspecified: Secondary | ICD-10-CM

## 2024-09-17 DIAGNOSIS — L814 Other melanin hyperpigmentation: Secondary | ICD-10-CM | POA: Diagnosis not present

## 2024-09-17 DIAGNOSIS — L578 Other skin changes due to chronic exposure to nonionizing radiation: Secondary | ICD-10-CM | POA: Diagnosis not present

## 2024-09-17 DIAGNOSIS — D1801 Hemangioma of skin and subcutaneous tissue: Secondary | ICD-10-CM

## 2024-09-17 DIAGNOSIS — L821 Other seborrheic keratosis: Secondary | ICD-10-CM

## 2024-09-17 DIAGNOSIS — D485 Neoplasm of uncertain behavior of skin: Secondary | ICD-10-CM

## 2024-09-17 NOTE — Patient Instructions (Addendum)
 Patient Handout: Wound Care for Skin Biopsy Site  Taking Care of Your Skin Biopsy Site  Proper care of the biopsy site is essential for promoting healing and minimizing scarring. This handout provides instructions on how to care for your biopsy site to ensure optimal recovery.  1. Cleaning the Wound:  Clean the biopsy site daily with gentle soap and water. Gently pat the area dry with a clean, soft towel. Avoid harsh scrubbing or rubbing the area, as this can irritate the skin and delay healing.  2. Applying Aquaphor and Bandage:  After cleaning the wound, apply a thin layer of Aquaphor ointment to the biopsy site. Cover the area with a sterile bandage to protect it from dirt, bacteria, and friction. Change the bandage daily or as needed if it becomes soiled or wet.  3. Continued Care for One Week:  Repeat the cleaning, Aquaphor application, and bandaging process daily for one week following the biopsy procedure. Keeping the wound clean and moist during this initial healing period will help prevent infection and promote optimal healing.  4. Massaging Aquaphor into the Area:  ---After one week, discontinue the use of bandages but continue to apply Aquaphor to the biopsy site. ----Gently massage the Aquaphor into the area using circular motions. ---Massaging the skin helps to promote circulation and prevent the formation of scar tissue.   Additional Tips:  Avoid exposing the biopsy site to direct sunlight during the healing process, as this can cause hyperpigmentation or worsen scarring. If you experience any signs of infection, such as increased redness, swelling, warmth, or drainage from the wound, contact your healthcare provider immediately. Follow any additional instructions provided by your healthcare provider for caring for the biopsy site and managing any discomfort. Conclusion:  Taking proper care of your skin biopsy site is crucial for ensuring optimal healing and  minimizing scarring. By following these instructions for cleaning, applying Aquaphor, and massaging the area, you can promote a smooth and successful recovery. If you have any questions or concerns about caring for your biopsy site, don't hesitate to contact your healthcare provider for guidance.   Important Information  Due to recent changes in healthcare laws, you may see results of your pathology and/or laboratory studies on MyChart before the doctors have had a chance to review them. We understand that in some cases there may be results that are confusing or concerning to you. Please understand that not all results are received at the same time and often the doctors may need to interpret multiple results in order to provide you with the best plan of care or course of treatment. Therefore, we ask that you please give [place] 2 business days to thoroughly review all your results before contacting the office for clarification. Should we see a critical lab result, you will be contacted sooner.   If You Need Anything After Your Visit  If you have any questions or concerns for your doctor, please call our main line at [phone] If no one answers, please leave a voicemail as directed and we will return your call as soon as possible. Messages left after 4 pm will be answered the following business day.   You may also send [place] a message via MyChart. We typically respond to MyChart messages within 1-2 business days.  For prescription refills, please ask your pharmacy to contact our office. Our fax number is [phone].  If you have an urgent issue when the clinic is closed that cannot wait until the next business day,  you can page your doctor at the number below.    Please note that while we do our best to be available for urgent issues outside of office hours, we are not available 24/7.   If you have an urgent issue and are unable to reach [place], you may choose to seek medical care at your doctor's office,  retail clinic, urgent care center, or emergency room.  If you have a medical emergency, please immediately call 911 or go to the emergency department. In the event of inclement weather, please call our main line at [phone] for an update on the status of any delays or closures.  Dermatology Medication Tips: Please keep the boxes that topical medications come in in order to help keep track of the instructions about where and how to use these. Pharmacies typically print the medication instructions only on the boxes and not directly on the medication tubes.   If your medication is too expensive, please contact our office at [phone] or send [place] a message through MyChart.   We are unable to tell what your co-pay for medications will be in advance as this is different depending on your insurance coverage. However, we may be able to find a substitute medication at lower cost or fill out paperwork to get insurance to cover a needed medication.   If a prior authorization is required to get your medication covered by your insurance company, please allow [place] 1-2 business days to complete this process.  Drug prices often vary depending on where the prescription is filled and some pharmacies may offer cheaper prices.  The website www.goodrx.com contains coupons for medications through different pharmacies. The prices here do not account for what the cost may be with help from insurance (it may be cheaper with your insurance), but the website can give you the price if you did not use any insurance.  - You can print the associated coupon and take it with your prescription to the pharmacy.  - You may also stop by our office during regular business hours and pick up a GoodRx coupon card.  - If you need your prescription sent electronically to a different pharmacy, notify our office through [facility] or by phone at [phone]    Skin Education :   I counseled the patient regarding the  following: Sun screen (SPF 30 or greater) should be applied during peak UV exposure (between 10am and 2pm) and reapplied after exercise or swimming.  The ABCDEs of melanoma were reviewed with the patient, and the importance of monthly self-examination of moles was emphasized. Should any moles change in shape or color, or itch, bleed or burn, pt will contact our office for evaluation sooner then their interval appointment.  Plan: Sunscreen Recommendations I recommended a broad spectrum sunscreen with a SPF of 30 or higher. I explained that SPF 30 sunscreens block approximately 97 percent of the sun's harmful rays. Sunscreens should be applied at least 15 minutes prior to expected sun exposure and then every 2 hours after that as long as sun exposure continues. If swimming or exercising sunscreen should be reapplied every 45 minutes to an hour after getting wet or sweating. One ounce, or the equivalent of a shot glass full of sunscreen, is adequate to protect the skin not covered by a bathing suit. I also recommended a lip balm with a sunscreen as well. Sun protective clothing can be used in lieu of sunscreen but must be worn the entire time you are exposed  to the sun's rays.

## 2024-09-17 NOTE — Telephone Encounter (Signed)
 Copied from CRM [id number]. Topic: Clinical - Medication Question >> Sep 17, 2024 12:38 PM Taleah C wrote: Reason for CRM: Savanna from [facility] called to verify the dose on atorvastatin  20 mg because he taking it differently than how its prescribed. Pt is taking one tablet every other day. Please call and advise at [phone] case ID: [id number].

## 2024-09-17 NOTE — Progress Notes (Unsigned)
   New Patient Visit   Subjective  Collin Garza is a 73 y.o. male who presents for the following: Skin Cancer Screening and Full Body Skin Exam  The patient presents for Total-Body Skin Exam (TBSE) for skin cancer screening and mole check. The patient has spots, moles and lesions to be evaluated, some may be new or changing.  The following portions of the chart were reviewed this encounter and updated as appropriate: medications, allergies, medical history  Review of Systems:  No other skin or systemic complaints except as noted in HPI or Assessment and Plan.  Objective  Well appearing patient in no apparent distress; mood and affect are within normal limits.  A full examination was performed including scalp, head, eyes, ears, nose, lips, neck, chest, axillae, abdomen, back, buttocks, bilateral upper extremities, bilateral lower extremities, hands, feet, fingers, toes, fingernails, and toenails. All findings within normal limits unless otherwise noted below.   Relevant physical exam findings are noted in the Assessment and Plan.  Right Thigh - Anterior 4mm red papule   Assessment & Plan   SKIN CANCER SCREENING PERFORMED TODAY.  ACTINIC DAMAGE - Chronic condition, secondary to cumulative UV/sun exposure - diffuse scaly erythematous macules with underlying dyspigmentation - Recommend daily broad spectrum sunscreen SPF 30+ to sun-exposed areas, reapply every 2 hours as needed.  - Staying in the shade or wearing long sleeves, sun glasses (UVA+UVB protection) and wide brim hats (4-inch brim around the entire circumference of the hat) are also recommended for sun protection.  - Call for new or changing lesions.  LENTIGINES, SEBORRHEIC KERATOSES, HEMANGIOMAS - Benign normal skin lesions - Benign-appearing - Call for any changes  MELANOCYTIC NEVI - Tan-brown and/or pink-flesh-colored symmetric macules and papules - Benign appearing on exam today - Observation - Call clinic for new  or changing moles - Recommend daily use of broad spectrum spf 30+ sunscreen to sun-exposed areas.  NEOPLASM OF UNCERTAIN BEHAVIOR OF SKIN Right Thigh - Anterior Skin / nail biopsy Type of biopsy: tangential   Informed consent: discussed and consent obtained   Timeout: patient name, date of birth, surgical site, and procedure verified   Procedure prep:  Patient was prepped and draped in usual sterile fashion Prep type:  Isopropyl alcohol Anesthesia: the lesion was anesthetized in a standard fashion   Anesthetic:  1% lidocaine  w/ epinephrine  1-100,000 buffered w/ 8.4% NaHCO3 Instrument used: DermaBlade   Hemostasis achieved with: aluminum chloride   Outcome: patient tolerated procedure well   Post-procedure details: sterile dressing applied and wound care instructions given   Dressing type: petrolatum gauze and bandage    Specimen 1 - Surgical pathology Differential Diagnosis: r/o NMSC vs other   Check Margins: No  Return in about 1 year (around 09/17/2025) for TBSC.  I, Berwyn Lesches, Surg Tech III, am acting as scribe for RUFUS CHRISTELLA HOLY, MD.   Documentation: I have reviewed the above documentation for accuracy and completeness, and I agree with the above.  RUFUS CHRISTELLA HOLY, MD

## 2024-09-18 ENCOUNTER — Ambulatory Visit: Payer: Self-pay | Admitting: Dermatology

## 2024-09-18 LAB — SURGICAL PATHOLOGY

## 2025-01-12 ENCOUNTER — Ambulatory Visit: Admitting: Family Medicine

## 2025-06-15 ENCOUNTER — Ambulatory Visit

## 2025-09-22 ENCOUNTER — Ambulatory Visit: Admitting: Dermatology
